# Patient Record
Sex: Male | Born: 1965 | Race: White | Hispanic: No | Smoking: Current some day smoker
Health system: Southern US, Community
[De-identification: ages and names within clinical notes are randomized; demographics above are authoritative.]

## PROBLEM LIST (undated history)

## (undated) DIAGNOSIS — R42 Dizziness and giddiness: Secondary | ICD-10-CM

## (undated) DIAGNOSIS — I219 Acute myocardial infarction, unspecified: Secondary | ICD-10-CM

## (undated) DIAGNOSIS — F419 Anxiety disorder, unspecified: Secondary | ICD-10-CM

## (undated) DIAGNOSIS — I639 Cerebral infarction, unspecified: Secondary | ICD-10-CM

## (undated) DIAGNOSIS — I10 Essential (primary) hypertension: Secondary | ICD-10-CM

## (undated) DIAGNOSIS — I2510 Atherosclerotic heart disease of native coronary artery without angina pectoris: Secondary | ICD-10-CM

## (undated) DIAGNOSIS — K5792 Diverticulitis of intestine, part unspecified, without perforation or abscess without bleeding: Secondary | ICD-10-CM

## (undated) DIAGNOSIS — N200 Calculus of kidney: Secondary | ICD-10-CM

## (undated) HISTORY — DX: Anxiety disorder, unspecified: F41.9

## (undated) HISTORY — DX: Cerebral infarction, unspecified: I63.9

## (undated) HISTORY — DX: Diverticulitis of intestine, part unspecified, without perforation or abscess without bleeding: K57.92

## (undated) HISTORY — DX: Dizziness and giddiness: R42

## (undated) HISTORY — DX: Calculus of kidney: N20.0

## (undated) HISTORY — PX: WISDOM TOOTH EXTRACTION: SHX21

## (undated) HISTORY — DX: Atherosclerotic heart disease of native coronary artery without angina pectoris: I25.10

---

## 1998-07-22 ENCOUNTER — Emergency Department (HOSPITAL_COMMUNITY): Admission: EM | Admit: 1998-07-22 | Discharge: 1998-07-22 | Payer: Self-pay | Admitting: Emergency Medicine

## 2004-12-24 HISTORY — PX: ANTERIOR CRUCIATE LIGAMENT REPAIR: SHX115

## 2005-02-21 ENCOUNTER — Ambulatory Visit: Payer: Self-pay | Admitting: Psychiatry

## 2005-02-21 ENCOUNTER — Other Ambulatory Visit (HOSPITAL_COMMUNITY): Admission: RE | Admit: 2005-02-21 | Discharge: 2005-03-08 | Payer: Self-pay | Admitting: Psychiatry

## 2006-03-05 ENCOUNTER — Encounter: Admission: RE | Admit: 2006-03-05 | Discharge: 2006-03-05 | Payer: Self-pay | Admitting: Family Medicine

## 2006-03-18 ENCOUNTER — Ambulatory Visit (HOSPITAL_BASED_OUTPATIENT_CLINIC_OR_DEPARTMENT_OTHER): Admission: RE | Admit: 2006-03-18 | Discharge: 2006-03-18 | Payer: Self-pay | Admitting: Orthopedic Surgery

## 2006-12-24 DIAGNOSIS — I219 Acute myocardial infarction, unspecified: Secondary | ICD-10-CM

## 2006-12-24 HISTORY — DX: Acute myocardial infarction, unspecified: I21.9

## 2006-12-24 HISTORY — PX: CORONARY STENT PLACEMENT: SHX1402

## 2007-08-11 ENCOUNTER — Ambulatory Visit: Payer: Self-pay | Admitting: Family Medicine

## 2007-10-23 ENCOUNTER — Ambulatory Visit: Payer: Self-pay | Admitting: Psychiatry

## 2007-10-23 ENCOUNTER — Inpatient Hospital Stay (HOSPITAL_COMMUNITY): Admission: AD | Admit: 2007-10-23 | Discharge: 2007-10-26 | Payer: Self-pay | Admitting: Psychiatry

## 2008-01-21 ENCOUNTER — Ambulatory Visit: Payer: Self-pay | Admitting: *Deleted

## 2008-01-21 ENCOUNTER — Inpatient Hospital Stay (HOSPITAL_COMMUNITY): Admission: EM | Admit: 2008-01-21 | Discharge: 2008-01-23 | Payer: Self-pay | Admitting: Orthopedic Surgery

## 2008-02-02 ENCOUNTER — Ambulatory Visit: Payer: Self-pay | Admitting: Cardiology

## 2008-02-02 LAB — CONVERTED CEMR LAB
Hemoglobin, Urine: NEGATIVE
Ketones, ur: NEGATIVE mg/dL
Leukocytes, UA: NEGATIVE
Specific Gravity, Urine: 1.015 (ref 1.000–1.03)
Total Protein, Urine: NEGATIVE mg/dL
Urine Glucose: NEGATIVE mg/dL
pH: 6 (ref 5.0–8.0)

## 2008-03-17 ENCOUNTER — Ambulatory Visit: Payer: Self-pay | Admitting: Cardiology

## 2008-04-27 ENCOUNTER — Ambulatory Visit: Payer: Self-pay | Admitting: Psychology

## 2008-05-11 ENCOUNTER — Ambulatory Visit: Payer: Self-pay | Admitting: Psychology

## 2008-05-26 ENCOUNTER — Ambulatory Visit: Payer: Self-pay | Admitting: Psychology

## 2008-06-09 ENCOUNTER — Ambulatory Visit: Payer: Self-pay | Admitting: Psychology

## 2008-06-24 ENCOUNTER — Ambulatory Visit: Payer: Self-pay | Admitting: Psychology

## 2008-07-08 ENCOUNTER — Ambulatory Visit: Payer: Self-pay | Admitting: Psychology

## 2008-07-22 ENCOUNTER — Ambulatory Visit: Payer: Self-pay | Admitting: Psychology

## 2008-07-30 ENCOUNTER — Ambulatory Visit: Payer: Self-pay | Admitting: Psychology

## 2008-08-05 ENCOUNTER — Ambulatory Visit: Payer: Self-pay | Admitting: Psychology

## 2008-08-12 ENCOUNTER — Ambulatory Visit: Payer: Self-pay | Admitting: Psychology

## 2008-08-19 ENCOUNTER — Ambulatory Visit: Payer: Self-pay | Admitting: Psychology

## 2008-08-26 ENCOUNTER — Ambulatory Visit: Payer: Self-pay | Admitting: Psychology

## 2008-09-16 ENCOUNTER — Ambulatory Visit: Payer: Self-pay | Admitting: Psychology

## 2008-09-30 ENCOUNTER — Ambulatory Visit: Payer: Self-pay | Admitting: Psychology

## 2008-10-14 ENCOUNTER — Ambulatory Visit: Payer: Self-pay | Admitting: Psychology

## 2008-10-28 ENCOUNTER — Ambulatory Visit: Payer: Self-pay | Admitting: Psychology

## 2008-11-11 ENCOUNTER — Ambulatory Visit: Payer: Self-pay | Admitting: Psychology

## 2008-11-25 ENCOUNTER — Ambulatory Visit: Payer: Self-pay | Admitting: Psychology

## 2008-12-09 ENCOUNTER — Ambulatory Visit: Payer: Self-pay | Admitting: Psychology

## 2008-12-23 ENCOUNTER — Ambulatory Visit: Payer: Self-pay | Admitting: Psychology

## 2009-01-06 ENCOUNTER — Ambulatory Visit: Payer: Self-pay | Admitting: Psychology

## 2009-01-20 ENCOUNTER — Ambulatory Visit: Payer: Self-pay | Admitting: Psychology

## 2009-02-03 ENCOUNTER — Ambulatory Visit: Payer: Self-pay | Admitting: Psychology

## 2009-02-17 ENCOUNTER — Ambulatory Visit: Payer: Self-pay | Admitting: Psychology

## 2009-03-03 ENCOUNTER — Ambulatory Visit: Payer: Self-pay | Admitting: Psychology

## 2009-03-17 ENCOUNTER — Ambulatory Visit: Payer: Self-pay | Admitting: Psychology

## 2009-03-24 ENCOUNTER — Ambulatory Visit: Payer: Self-pay | Admitting: Family Medicine

## 2009-03-31 ENCOUNTER — Ambulatory Visit: Payer: Self-pay | Admitting: Psychology

## 2009-04-14 ENCOUNTER — Ambulatory Visit: Payer: Self-pay | Admitting: Psychology

## 2009-04-28 ENCOUNTER — Ambulatory Visit: Payer: Self-pay | Admitting: Psychology

## 2009-06-09 ENCOUNTER — Ambulatory Visit: Payer: Self-pay | Admitting: Psychology

## 2009-06-23 ENCOUNTER — Ambulatory Visit: Payer: Self-pay | Admitting: Psychology

## 2009-07-07 ENCOUNTER — Ambulatory Visit: Payer: Self-pay | Admitting: Psychology

## 2009-07-21 ENCOUNTER — Ambulatory Visit: Payer: Self-pay | Admitting: Psychology

## 2009-08-04 ENCOUNTER — Ambulatory Visit: Payer: Self-pay | Admitting: Psychology

## 2009-08-18 ENCOUNTER — Ambulatory Visit: Payer: Self-pay | Admitting: Psychology

## 2009-09-15 ENCOUNTER — Ambulatory Visit: Payer: Self-pay | Admitting: Psychology

## 2009-09-27 ENCOUNTER — Ambulatory Visit: Payer: Self-pay | Admitting: Family Medicine

## 2009-09-29 ENCOUNTER — Ambulatory Visit: Payer: Self-pay | Admitting: Psychology

## 2009-09-30 ENCOUNTER — Ambulatory Visit: Payer: Self-pay | Admitting: Family Medicine

## 2009-10-13 ENCOUNTER — Ambulatory Visit: Payer: Self-pay | Admitting: Psychology

## 2009-10-27 ENCOUNTER — Ambulatory Visit: Payer: Self-pay | Admitting: Psychology

## 2009-11-10 ENCOUNTER — Ambulatory Visit: Payer: Self-pay | Admitting: Psychology

## 2009-12-08 ENCOUNTER — Ambulatory Visit: Payer: Self-pay | Admitting: Psychology

## 2009-12-22 ENCOUNTER — Ambulatory Visit: Payer: Self-pay | Admitting: Psychology

## 2010-01-05 ENCOUNTER — Ambulatory Visit: Payer: Self-pay | Admitting: Psychology

## 2010-01-19 ENCOUNTER — Ambulatory Visit: Payer: Self-pay | Admitting: Psychology

## 2010-02-16 ENCOUNTER — Ambulatory Visit: Payer: Self-pay | Admitting: Psychology

## 2010-03-02 ENCOUNTER — Ambulatory Visit: Payer: Self-pay | Admitting: Psychology

## 2010-03-16 ENCOUNTER — Ambulatory Visit: Payer: Self-pay | Admitting: Psychology

## 2010-03-30 ENCOUNTER — Ambulatory Visit: Payer: Self-pay | Admitting: Psychology

## 2010-04-27 ENCOUNTER — Ambulatory Visit: Payer: Self-pay | Admitting: Psychology

## 2010-05-11 ENCOUNTER — Ambulatory Visit: Payer: Self-pay | Admitting: Psychology

## 2010-05-25 ENCOUNTER — Ambulatory Visit: Payer: Self-pay | Admitting: Psychology

## 2010-06-08 ENCOUNTER — Ambulatory Visit: Payer: Self-pay | Admitting: Psychology

## 2010-06-22 ENCOUNTER — Ambulatory Visit: Payer: Self-pay | Admitting: Psychology

## 2010-07-06 ENCOUNTER — Ambulatory Visit: Payer: Self-pay | Admitting: Psychology

## 2010-08-03 ENCOUNTER — Ambulatory Visit: Payer: Self-pay | Admitting: Psychology

## 2010-08-17 ENCOUNTER — Ambulatory Visit: Payer: Self-pay | Admitting: Psychology

## 2010-08-31 ENCOUNTER — Ambulatory Visit: Payer: Self-pay | Admitting: Psychology

## 2010-09-14 ENCOUNTER — Ambulatory Visit: Payer: Self-pay | Admitting: Psychology

## 2010-09-22 ENCOUNTER — Ambulatory Visit: Payer: Self-pay | Admitting: Licensed Clinical Social Worker

## 2010-09-28 ENCOUNTER — Ambulatory Visit: Payer: Self-pay | Admitting: Psychology

## 2010-09-29 ENCOUNTER — Ambulatory Visit: Payer: Self-pay | Admitting: Licensed Clinical Social Worker

## 2010-10-06 ENCOUNTER — Ambulatory Visit: Payer: Self-pay | Admitting: Licensed Clinical Social Worker

## 2010-10-12 ENCOUNTER — Ambulatory Visit: Payer: Self-pay | Admitting: Psychology

## 2010-10-20 ENCOUNTER — Ambulatory Visit: Payer: Self-pay | Admitting: Licensed Clinical Social Worker

## 2010-10-26 ENCOUNTER — Ambulatory Visit: Payer: Self-pay | Admitting: Psychology

## 2010-10-27 ENCOUNTER — Ambulatory Visit: Payer: Self-pay | Admitting: Licensed Clinical Social Worker

## 2010-11-09 ENCOUNTER — Ambulatory Visit: Payer: Self-pay | Admitting: Psychology

## 2010-11-10 ENCOUNTER — Ambulatory Visit: Payer: Self-pay | Admitting: Licensed Clinical Social Worker

## 2010-11-23 ENCOUNTER — Ambulatory Visit: Payer: Self-pay | Admitting: Psychology

## 2010-11-24 ENCOUNTER — Ambulatory Visit: Payer: Self-pay | Admitting: Licensed Clinical Social Worker

## 2010-12-01 ENCOUNTER — Ambulatory Visit: Payer: Self-pay | Admitting: Licensed Clinical Social Worker

## 2010-12-07 ENCOUNTER — Ambulatory Visit: Payer: Self-pay | Admitting: Psychology

## 2011-01-04 ENCOUNTER — Ambulatory Visit
Admission: RE | Admit: 2011-01-04 | Discharge: 2011-01-04 | Payer: Self-pay | Source: Home / Self Care | Attending: Psychology | Admitting: Psychology

## 2011-01-18 ENCOUNTER — Ambulatory Visit
Admission: RE | Admit: 2011-01-18 | Discharge: 2011-01-18 | Payer: Self-pay | Source: Home / Self Care | Attending: Psychology | Admitting: Psychology

## 2011-02-01 ENCOUNTER — Ambulatory Visit: Payer: Self-pay | Admitting: Psychology

## 2011-02-15 ENCOUNTER — Ambulatory Visit (INDEPENDENT_AMBULATORY_CARE_PROVIDER_SITE_OTHER): Payer: Self-pay | Admitting: Psychology

## 2011-02-15 DIAGNOSIS — F331 Major depressive disorder, recurrent, moderate: Secondary | ICD-10-CM

## 2011-03-01 ENCOUNTER — Ambulatory Visit (INDEPENDENT_AMBULATORY_CARE_PROVIDER_SITE_OTHER): Payer: BC Managed Care – PPO | Admitting: Psychology

## 2011-03-01 DIAGNOSIS — F331 Major depressive disorder, recurrent, moderate: Secondary | ICD-10-CM

## 2011-03-15 ENCOUNTER — Ambulatory Visit: Payer: BC Managed Care – PPO | Admitting: Psychology

## 2011-03-29 ENCOUNTER — Ambulatory Visit (INDEPENDENT_AMBULATORY_CARE_PROVIDER_SITE_OTHER): Payer: Self-pay | Admitting: Psychology

## 2011-03-29 DIAGNOSIS — F331 Major depressive disorder, recurrent, moderate: Secondary | ICD-10-CM

## 2011-04-12 ENCOUNTER — Ambulatory Visit: Payer: BC Managed Care – PPO | Admitting: Psychology

## 2011-04-26 ENCOUNTER — Ambulatory Visit: Payer: Self-pay | Admitting: Psychology

## 2011-05-08 NOTE — Assessment & Plan Note (Signed)
[facility]                            CARDIOLOGY OFFICE NOTE   ESMERALDA, BLANFORD                     MRN:          [id number]  DATE:02/02/2008                            DOB:          November 24, 1966    PRIMARY CARE PHYSICIAN:  Dr. Blair Heys.   CLINICAL HISTORY:  Mr. Lumadue is 45 year old and has a history of  hypertension and presented to [facility] with chest pain and he had  transient ST elevation inferior leads.  His pain resolved and ST-segment  elevation resolved has not taken emergently to the cath lab but he was  studied early the next day and by Dr. Excell Seltzer and found to have 2  ruptured plaque and the mid-to-distal and distal right coronary treated  with non-overlapping PROMUS stent.  He had minimal elevation of his  enzymes and LV function and angiography was normal.   He has done quite well since that time had no recurrent chest pain,  shortness breath or palpitations.   PAST MEDICAL HISTORY:  Significant for hypertension, history of the  depression for which he takes Celexa and smoking history.  His current  medications include Micardis/hydrochlorothiazide 80/25 daily, Flomax,  aspirin, Lipitor 80 mg daily.  Metoprolol extended release 25 mg daily,  Plavix 75 mg daily.   EXAMINATION:  Today blood pressure was 123/86 and pulse 68 and regular.  There was no venous distension.  The carotid were full without bruits.  CHEST:  Clear.  HEART:  Rhythm is regular.  No murmurs or gallops.  Abdomen was soft organomegaly.  Right femoral artery site had moderate  knot but there was no widening pulsation and no bruits.  No peripheral  edema.   The electrocardiogram was normal.   IMPRESSION:  1. Recent aborted diaphragmatic wall infarction treated with      subsequent stenting of the mid-to-distal and distal right coronary      with drug-eluting stent.  2. Normal LV function.  3. Hypertension.  4. Depression.  5. Hyperlipidemia.   RECOMMENDATIONS:  I think Mr. Cadman is doing well.  He is still  smoking about half a pack of cigarettes a day down from two packs a day  and says he is making progress.  He is scheduled to be involved with  rehab too.  Discuss the possibility of adding drug help with smoking but  he said he tried a nicotine patch and they gave a rash.  His history of  depression, will not want to use Chantix, might consider switching from  Celexa, Wellbutrin the future if he is having difficulty.  His HDL was  30 and I think he would be better off Crestor and Lipitor 80 so we will  switch him from Lipitor 80 to Crestor 20,   after finishes Lipitor.  Will get a lipid and liver profile in 6 weeks.  I will see him back in 6 weeks.  He can resume work at this point  without restrictions.  He works as a Curator.   ADDENDUM:  He did indicate is having frequency of urination and I we got  a urinalysis today to evaluate that.     Bruce Elvera Lennox Juanda Chance, MD, [facility]  Electronically Signed    BRB/MedQ  DD: 02/02/2008  DT: 02/02/2008  Job #: [id number]

## 2011-05-08 NOTE — Cardiovascular Report (Signed)
NAME:  Ricardo Stone, Ricardo Stone NO.:  [id number]   MEDICAL RECORD NO.:  [id number]          PATIENT TYPE:  INP   LOCATION:  2908                         FACILITY:  [facility]   PHYSICIAN:  Veverly Fells. Excell Seltzer, MD  DATE OF BIRTH:  03-20-1966   DATE OF PROCEDURE:  01/21/2008  DATE OF DISCHARGE:                            CARDIAC CATHETERIZATION   PROCEDURE:  Left heart catheterization, selective coronary angiography,  left ventricular angiography, PTCA and stenting of the right coronary  artery, and Angio-Seal of the right femoral artery.   INDICATION:  Ricardo Stone is a 45 year old gentleman with a history of  tobacco use and hypertension.  He presented last night with substernal  chest pain.  His EKG demonstrated transient inferoposterior injury  current.  After initiating medical therapy with Plavix, heparin and  nitroglycerin, his ST changes completely resolved.  His troponin is now  elevated at 0.4,  and he was referred for cardiac catheterization.   Risks and indications of the procedure were reviewed with the patient in  detail.  Informed consent was obtained.  The right groin was prepped,  draped, anesthetized with 1% lidocaine.  Using the modified Seldinger  technique, a 6-French sheath was placed in the right femoral artery.  Standard 6-French Judkins catheters were used for coronary angiography.  An angled pigtail catheter was used for left ventriculography.  A  pullback across the aortic valve was performed.   At completion of the diagnostic procedure, I elected to intervene on the  right coronary artery.  The right coronary artery had a moderate plaque  at the junction of the mid and distal vessel and a severe,  ulcerated-  appearing plaque at the distal vessel that involved the PDA, posterior  AV branch bifurcation.  The patient had been preloaded with Plavix.  Angiomax was used for anticoagulation.  The 6-French sheath was changed  out for a 7-French sheath in  anticipation of treating the bifurcation.  A 6-French JR-4 guide catheter was inserted.  Once a therapeutic ACT was  achieved, a Cougar guidewire was passed into the posterolateral branch  and a BMW guidewire was passed into the PDA branch.  The lesion was  wired with a moderate amount of difficulty.  There were tandem lesions  in the vessel, and I elected to treat the distal bifurcation area first.  A 2.0 x 15 mm Maverick balloon was placed across the distal right  coronary artery into the PDA and was dilated to 6 atmospheres on  multiple inflations.  A 2.0 x 15 mm Voyager balloon was then placed from  the distal right coronary artery into the posterior AV branch, and  kissing balloon inflations were performed up to 6 atmospheres on the  Maverick and 8 atmospheres on the Voyager.  At that point, the balloons  were removed.  Nitroglycerin was given and angiography was performed  once again.  I elected to stent into the PDA branch.  Both branches were  of equal size.  A 2.5 x 18 mm Promus stent was deployed at 12  atmospheres and was well expanded.  The stent was then  postdilated with  a 2.75 x 12 mm Quantum Maverick balloon up to 16 and then 18  atmospheres.  Following stenting, there was an excellent angiographic  result with TIMI III flow into both branch vessels.  There was  significant ostial stenosis of the AV branch, but there was TIMI III  flow and the patient was chest pain free.  I then primarily stented the  more proximal lesion with a 3.0 x 18 mm Promus stent, which was deployed  at 16 atmospheres.  The stent was well expanded.  The stented segment  was then postdilated with 3.5 x 12 mm Quantum Maverick up to 16  atmospheres on 2 inflations.  At the completion of the procedure, there  was an excellent angiographic result with TIMI III flow throughout the  vessel.  The PDA branch had an excellent result.  The posterior AV  branch had a compromised ostium with significant ostial  stenosis but  with normal flow, and the patient was chest pain free.  I elected not to  treat that vessel.  An Angio-Seal device was used to seal the femoral  arteriotomy.  The patient tolerated the entire procedure very well and  had no immediate complications.   FINDINGS:  Aortic pressure 103/67 with a mean of 83, left ventricular  pressure 103/12.   Left mainstem:  The left mainstem is a very short segment with no  significant stenosis, bifurcates into the LAD and left circumflex.   LAD:  The LAD is a large-caliber vessel that just reaches the distal  anterior wall.  Proximally there is nonobstructive plaque.  Just beyond  the second diagonal branch there is mild diffuse stenosis of  approximately 30%.  The remaining portions of the vessel have no  significant angiographic disease.  There is a small first diagonal and a  medium-sized second diagonal branch.  The second diagonal has 30%  proximal stenosis.   Left circumflex is large-caliber.  It courses down and supplies a large  first OM branch that has no significant angiographic stenosis.  The AV  groove circumflex is a smaller vessel that also has no significant  stenosis.   The right coronary artery is dominant.  It is a large vessel that  bifurcates into the PDA and posterior AV branch that supplies one  posterolateral.  The proximal and midportions of the right coronary  artery have nonobstructive plaque.  The distal portion has a 70%  stenosis, and the very distal right coronary artery at the bifurcation  of the PDA and posterior AV segment has a 95% ulcerated plaque with a  hypodense area.  There is TIMI III flow in the vessel.   Left ventriculography is normal.  The LVEF is 65% to 70%.  There is no  mitral regurgitation.   ASSESSMENT:  1. Severe distal right coronary artery stenosis (ulcerated plaque)      with successful percutaneous coronary intervention using two Promus      drug-eluting stents.  2. Mild left  anterior descending stenosis.  3. Normal left circumflex.  4. Normal left ventricular function.   PLAN:  Ricardo Stone should receive a minimum of 12 months of dual  antiplatelet therapy with aspirin and Plavix.  Post MI medical therapy  will be instituted as tolerated.  He will be observed carefully in the  recovery area.      Veverly Fells. Excell Seltzer, MD  Electronically Signed     MDC/MEDQ  D:  01/21/2008  T:  01/21/2008  Job:  [id number]   cc:   Bryan Lemma. Manus Gunning, M.D.

## 2011-05-08 NOTE — H&P (Signed)
NAME:  Ricardo Stone, Ricardo Stone NO.:  [id number]   MEDICAL RECORD NO.:  [id number]          PATIENT TYPE:  INP   LOCATION:  2908                         FACILITY:  [facility]   PHYSICIAN:  Unice Cobble, MD     DATE OF BIRTH:  02-Mar-1966   DATE OF ADMISSION:  01/21/2008  DATE OF DISCHARGE:                              HISTORY & PHYSICAL   PRIMARY CARE PHYSICIAN:  The patient's primary M.D. is Bryan Lemma.  Manus Gunning, M.D. from [place].   CHIEF COMPLAINT:  Chest pain.   HISTORY OF THE PRESENT ILLNESS:  This is a 45 year old white male with a  history of hypertension who presents with chest pain.  The pain started  30 minutes prior to arrival after having intercourse.  The patient had  6/10 chest discomfort with diaphoresis, nausea and vomiting, and  palpitations.  He has radiation of pain to both shoulders as well as  neck bilaterally.  In the ambulance he was thought to have 1 mm ST  elevation in the inferior leads with T wave inversions in the aVL.  A  Code STEMI was called.  He was given 324 mg of baby aspirin and  sublingual nitroglycerin.  In the emergency department he was given 600  mg of Plavix and a 3,000-unit heparin bolus with decrease of pain to  1/10, and located primarily only in the neck at this time.  His EKG  changes had completely resolved by that time.   PAST MEDICAL HISTORY:  1. Hypertension.  2. Depression.  3. Negative stress test two years ago.   ALLERGIES:  No known drug allergies.   MEDICATIONS:  1. Micardis 80/25 mg daily.  2. Lexapro 40 mg daily.  3. Diltiazem 240 mg extended release daily.   SOCIAL HISTORY:  Lives in [place] alone.  He is a Curator.  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  No alcohol or drugs.   FAMILY HISTORY:  The patient's father died of a heart attack at the age  of 35.   REVIEW OF SYSTEMS:  Complete review of systems was done and found to be  otherwise negative, except as stated in the HPI.   PHYSICAL EXAMINATION:  VITAL SIGNS:   The patient is afebrile with a  pulse of 73 and a respiratory rate of 12.  Blood pressure is 137/90.  His O2 sats are 98% on 2 liters.  GENERAL APPEARANCE:  In general this is a thin white male in no acute  distress.  HEENT:  The head, eyes, ears, nose and throat shows PERRLA.  EOMI.  Moist mucous membranes.  Oropharynx is clear without erythema or  exudates.  NECK:  The neck is supple without lymphadenopathy, thyromegaly, bruits  or jugular venous distention.  HEART:  The heart has a regular rate and rhythm with normal S1 and S2.  No murmurs, gallops or rubs.  The PMI is nondisplaced.  Pulses are 2+  and equal bilaterally without bruits.  LUNGS:  The lungs are clear to auscultation bilaterally.  SKIN:  The skin has no rash or lesions.  ABDOMEN:  The abdomen is soft  and nontender without rebound or guarding.  Normal bowel sounds.  Negative hepatosplenomegaly.  EXTREMITIES:  The extremities show no clubbing, cyanosis or edema.  SKIN:  No rashes, lesions or petechiae.  MUSCULOSKELETAL:  The musculoskeletal exam shows no joint deformities or  effusions.  No spinal tenderness.  NEUROLOGIC EXAMINATION:  Neurologically he is alert and oriented times  three with grossly normal cranial nerves.  Strength and sensation are  normal throughout.   LABORATORY DATA:  X-ray of his chest shows no acute cardiopulmonary  disease.  EKG shows a rate of 62 and normal sinus rhythm.  There is a T  wave inversion in the aVL.  There are no ST elevations or depressions.  Labs are pending at this time.   ASSESSMENT AND PLAN:  This is a 45 year old white male with a history of  hypertension and smoking, and a positive family history who presents  with chest pain.  His field electrocardiogram is concerning, but not  diagnostic for STEMI in that it has noncharacteristic ST changes, is not  clearly elevated in two contiguous leads, and does not have reciprocal  changes.  He furthermore has no ST elevations here  in the emergency  room.  His diagnosis could be coronary spasm versus resolved ST  elevation myocardial infarction versus non-ST elevation myocardial  infarction versus unstable angina or noncardiac chest pain.   I will treat him for presumed NSTEMI and treat with heparin, beta  blocker, nitroglycerin drip, aspirin, and morphine as needed.  I will  monitor his cardiac enzymes for signs of myocardial damage and plan on a  cardiac catheterization in the morning.      Unice Cobble, MD  Electronically Signed     ACJ/MEDQ  D:  01/21/2008  T:  01/21/2008  Job:  [id number]

## 2011-05-08 NOTE — Discharge Summary (Signed)
NAME:  Ricardo Stone, Ricardo Stone              ACCOUNT NO.:  [id number]   MEDICAL RECORD NO.:  [id number]          PATIENT TYPE:  INP   LOCATION:  2908                         FACILITY:  [facility]   PHYSICIAN:  Everardo Beals. Juanda Chance, MD, FACCDATE OF BIRTH:  Dec 01, 1966   DATE OF ADMISSION:  01/21/2008  DATE OF DISCHARGE:  01/23/2008                               DISCHARGE SUMMARY   PRIMARY CARDIOLOGIST:  Everardo Beals. Juanda Chance, MD, [facility]   PRIMARY CARE PHYSICIAN:  Bryan Lemma. Manus Gunning, M.D.   PROCEDURES PERFORMED DURING HOSPITALIZATION:  Emergent cardiac  catheterization by Bruce R. Juanda Chance, MD, [facility] revealing:  1. Severe distal right coronary artery stenosis (ulcerated plaque).  2. Successful PCI with 2 Promus drug-eluting stents.  3. Mild LAD stenosis, normal  left circumflex and normal LV function.   FINAL DISCHARGE DIAGNOSES:  1. Aborted ST elevated myocardial infarction.  2. Status post cardiac catheterization with PCI to the right coronary      artery using 2 drug-eluting stents.   SECONDARY DIAGNOSES:  1. Tobacco abuse.  2. Hypertension.  3. Depression.   HOSPITAL COURSE:  This is a 45 year old Caucasian male with a history of  hypertension who presented to the emergency room  with chest pain.  The  patient had chest pain approximately 30 minutes after having  intercourse. The patient had 6 to 10 chest discomfort with diaphoresis,  nausea, vomiting, and palpitations.  The radiation of the pain to both  shoulders as well as the neck bilaterally.  The patient called EMS.  The  EMS EKG revealed 1 ml ST elevation in the inferior leads with T wave  inversion in AVL.  Code STEMI was called and the patient was brought  emergently to cardiac catheterization lab at [facility]. He was given  324 mg of baby aspirin, sublingual nitroglycerin.  On arrival to the  emergency department, the patient was given 600 mg of Plavix  and 3,000  units of heparin bolus with decrease of pain to 1/10.  His EKG  completely  resolved on arrival to the emergency room .   The patient was seen and examined by Unice Cobble, MD fellow for Everardo Beals. Juanda Chance, MD, [facility].  The patient was admitted to ICU for further  observation with planned cardiac catheterization to follow in the  morning as emergent catheterization was unnecessary secondary to pain  relief and EKG changes resolved.   The following morning the patient had no complaints of chest pain.  He  was in normal sinus rhythm with no ectopy. The patient had no EKG  changes the following morning.  The patient was seen and examined by Dr.  Juanda Chance and had a cardiac catheterization completed on January 28, the  morning of admission with results described above.  Please see Dr.  Inda Castle through cardiac catheterization note for more detail.   The patient did have severe distal right coronary artery stenosis with  ulcerated plaque and had successful PCI using 2 Promus drug-eluting  stents per Dr. Juanda Chance.  The patient tolerated the procedure well with no  evidence of further discomfort or ectopy post procedure.  The patient's  right groin recovered well but did have some moderate amount of  ecchymosis noted.  There was mild soreness at the site but with no  severe complaints of pain.  No bruit.  No active bleeding.   During hospitalization, the patient's Cardizem  which he was on prior to  admission was discontinued and he was started on Lopressor 12.5 mg  b.i.d.  he was also started on aspirin, Plavix , Lipitor, and Avapro.  On review of medications prior to discharge, Dr. Juanda Chance resumed the  patient's Micardis, HCTZ, as he was taking at home and Metoprolol  substituted for Cardizem  and he was asked to stop taking Cardizem  at  home.   The patient was also given smoking cessation instructions along with  cardiac rehab instructions for post MI activities and life style  changes.  The patient verbalized understanding.  On the day of discharge  the patient was  seen and examined by Dr. Juanda Chance and myself and was found  to be stable.  He will follow up with Dr. Juanda Chance in his office in  approximately 2 weeks for continued evaluation of coronary disease and  the patient's response to medications.   DISCHARGE LABORATORY:  Sodium 137, potassium 3.5, chloride 110, CO2 of  24, BUN 8, creatinine 0.78, glucose 100, cholesterol 138, triglycerides  68, HDL 30, LDL 94, hemoglobin 15.4, hematocrit 45.2, white blood cells  14.8, platelets 153. Chest x-ray revealed questionable LVH otherwise  normal.   VITAL SIGNS AT DISCHARGE:  Blood pressure 119/78.  Heart rate 57.  Respirations  20.   DISCHARGE MEDICATIONS:  1. Aspirin  325 mg daily.  2. Plavix 75 mg daily (prescription provided).  3. Metoprolol 25 mg daily (prescription provided).  4. Lipitor 80 mg daily (prescription provided).  5. Nitroglycerin 0.4 mg p.r.n. sublingual for chest pain (prescription      provided).  6. Micardis/HCTZ 80/25 mg daily.  7. Citalopram 40 mg daily.   ALLERGIES:  No known drug allergies.   FOLLOWUP PLANS AND APPOINTMENTS:  1. The patient will follow up with Bruce R. Juanda Chance, MD, [facility] on      February 9 at 9:45 a.m. for continued evaluation and management of      the patient's coronary artery disease .  2. The patient has been given post cardiac catheterization      instructions with particular emphasis on the right groin site for      evidence of bleeding, severe pain, infection, or swelling.  3. The patient has been given smoking cessation instructions.  4. The patient is to follow up with his primary care physician for      continued medical management.  5. The patient has been seen by cardiac rehab and has been advised on      outpatient cardiac rehab and continued life style modification.  6. Time spent with the patient to include physician time 45 minutes.      Bettey Mare. Lyman Bishop, NP      Everardo Beals. Juanda Chance, MD, [facility]  Electronically Signed    KML/MEDQ  D:   01/23/2008  T:  01/23/2008  Job:  [id number]   cc:   Bryan Lemma. Manus Gunning, M.D.

## 2011-05-08 NOTE — Assessment & Plan Note (Signed)
[facility]                            CARDIOLOGY OFFICE NOTE   Ricardo, Stone                     MRN:          [id number]  DATE:03/17/2008                            DOB:          10-Jan-1966    PRIMARY CARE PHYSICIAN:  Bryan Lemma. Manus Gunning, M.D.   CLINICAL HISTORY:  Ricardo Stone is a 45 year old and returns for  management of his coronary heart disease.  He was hospitalized in  January with transient ST elevation which resolved, and he subsequently  had catheterization and two non-overlapping stents placed in the right  coronary by Dr. Excell Seltzer.  He had minimal elevation of his enzymes and his  LV function was normal.   He is a smoker and has not been able to stop yet.  He saw Dr. Manus Gunning  who prescribed the Chantix.   PAST MEDICAL HISTORY:  Significant for hypertension.   CURRENT MEDICATIONS:  1. Micardis/hydrochlorothiazide 80/25 daily.  2. Aspirin.  3. Metoprolol XL 25 mg daily.  4. Plavix 75 mg daily.  5. Crestor 20 mg daily.  6. Chantix which is not used, not yet started which was prescribed by      Dr. Manus Gunning.   PHYSICAL EXAMINATION:  VITAL SIGNS:  Blood pressure was 125/80 and pulse  71 and regular.  There was no venous distention.  The carpals were full  without bruits.  CHEST:  Clear.  HEART:  Rhythm is regular.  No murmurs or gallops.  ABDOMEN:  Soft, normal bowel sounds.  EXTREMITIES:  Peripheral pulses were full.  No peripheral edema.   IMPRESSION:  1. Coronary artery disease status post diaphragmatic wall infarction      with spontaneous reperfusion and subsequent placement of non-      overlapping drug-eluting stents in the right coronary artery      January 2009.  2. Normal LV function.  3. Hypertension.  4. Hyperlipidemia with low HDL.   RECOMMENDATIONS:  Ricardo Stone is doing well with exception of  cigarettes.  Dr. Manus Gunning is managing this, and has recommended Chantix.  Will need to be careful about the depressive  symptoms related to this.  Will plan to get a lipid liver profile to follow up on switching him  from  Lipitor to Crestor, hoping the HDL would be better on Crestor than  Lipitor.  I plan to see him back in follow-up in 6 months.     Bruce Elvera Lennox Juanda Chance, MD, [facility]  Electronically Signed    BRB/MedQ  DD: 03/17/2008  DT: 03/17/2008  Job #: [id number]

## 2011-05-10 ENCOUNTER — Ambulatory Visit (INDEPENDENT_AMBULATORY_CARE_PROVIDER_SITE_OTHER): Payer: Self-pay | Admitting: Psychology

## 2011-05-10 DIAGNOSIS — F331 Major depressive disorder, recurrent, moderate: Secondary | ICD-10-CM

## 2011-05-11 NOTE — Op Note (Signed)
NAME:  Ricardo Stone, Ricardo Stone              ACCOUNT NO.:  [id number]   MEDICAL RECORD NO.:  [id number]          PATIENT TYPE:  AMB   LOCATION:  DSC                          FACILITY:  [facility]   PHYSICIAN:  Feliberto Gottron. Turner Daniels, M.D.   DATE OF BIRTH:  Dec 14, 1966   DATE OF PROCEDURE:  03/18/2006  DATE OF DISCHARGE:  03/18/2006                                 OPERATIVE REPORT   PREOPERATIVE DIAGNOSIS:  Left knee anterior cruciate ligament tear, medial  and lateral meniscal tears.   POSTOPERATIVE DIAGNOSIS:  Left knee anterior cruciate ligament tear, medial  and lateral meniscal tears, fairly large cartilaginous loose body.   PROCEDURE:  Left knee partial arthroscopic medial meniscectomy, large parrot-  beak tear, partial lateral meniscectomy, posterior horn, and then allograft  anterior cruciate ligament reconstruction using an 11-mm-wide bone-tendon-  bone allograft and 8 x 20 femoral and tibial titanium screws, removal of  fairly large loose body.   SURGEON:  Feliberto Gottron. Turner Daniels, M.D.   FIRST ASSISTANT:  Skip Mayer, P.A.-C   ANESTHESIOLOGIST:  General endotracheal.   ESTIMATED BLOOD LOSS:  Minimal.   FLUID REPLACEMENT:  A liter of crystalloid.   DRAINS PLACED:  None.   TOURNIQUET TIME:  None.   INDICATIONS FOR PROCEDURE:  The patient is a 45 year old man who was  initially evaluated by one of my partners, Dr. Althea Charon, for a fall he  sustained off of a platform of about 3 feet, where he suffered a valgus  stress to his knee.  After a thorough workup including physical examination,  x-rays and finally an MRI scan, he was found to have an ACL tear and he was  also quite active.  Despite conservative treatment, he had continued giving  away of his knee and I saw him in consultation for possible ACL  reconstruction versus continued conservative care on 15 March 2006 and at  that time, he had pretty much a full range of motion and a positive  Lachman's test.  The MRI scan was reviewed and it did  show a complete ACL  tear, lateral and medial meniscal tears as well as an intermediate strain of  the lateral collateral ligament.  Risks and benefits of surgery were  discussed at length with the patient and he is prepared for surgical  intervention.   DESCRIPTION OF PROCEDURE:  The patient was identified by arm band and taken  to the operating room at [facility], where the appropriate  anesthetic monitors were attached and general endotracheal anesthesia  induced with the patient in a supine position, lateral post applied to the  table as well as a foot positioner and the left lower extremity was then  prepped and draped in the usual sterile fashion from the ankle to the mid-  thigh.  Using a #11 blade, standard inferomedial and inferolateral  parapatellar portals were then made, allowing introduction of the  arthroscope into the knee joint.  Diagnostic arthroscopy revealed a normal  patellofemoral articulation, a large parrot-beak tear of the posterior horn  of the medial meniscus, which was removed with the straight-biters as well  as a 4.2 Great White sucker shaver and a 3.5 gator sucker shaver.  We also  encountered a fairly large cartilaginous loose bodies, which was removed as  well.  We then directed our attention to the lateral compartment, where the  patient had a radial tear of the posterior horn of the lateral meniscus;  this was debrided back to a stable margin with a 4.2 Great White sucker  shaver.  We then directed our attention to the notch, where the ACL was  noted to be completely torn and it was resected back to the origin of the  femur and the insertion on the tibial.  The knee was then flexed to 45  degrees with the knee positioner and we performed a standard superolateral  notchplasty with a hooded vortex bur, removing 1 mm of bone from the  superior and lateral quadrants of the notch region.  The Linvatec tibial pin  positioning guide was then set at  55 degrees and placed through the  inferomedial portal so that the tip was on the posterior footprint of the  ACL insertion.  We then moved the entry guide about a centimeter and a half  medial to the tibial tubercle and made a 0.5-cm incision longitudinal in the  skin down to the bone, allowing passage of a pin up through the tibia a  centimeter and a half medial to and a centimeter distal to the tibial  tubercle, up through the posterior aspect of the tibial footprint.  At this  point, my skilled assistant, Skip Mayer, was at the back table preparing  the allograft and fashioning it into an 11-mm-wide bone-tendon-bone  construct with a 20-gauge wire through the tibial bone plug.  The patellar  bone plug was 23 mm in length.  We then over-reamed the guidepin on the knee  to 11 mm and then using the 7-mm over-the-top guide, placed a second pin in  the superior posterolateral aspect of the femoral notch at the 1:30 position  to a depth of about 35 mm and then over-reamed this with the 11-mm Badger  reamer to a depth of about 30 mm.  A superior notch was then placed in the  femoral tunnel and a lateral notch in the tibial tunnel using the Linvatec  notch-cutting guides.  The knee was then thoroughly irrigated out with  normal saline solution, all sharp edges were removed with the bur and the  4.2 Great White sucker shaver and then the graft was passed with the  patellar plug going up to the tibial tunnel first and then the patella plug  was then threaded into the femoral tunnel using a supplemental inferomedial  portal that was about 8 mm in length.  Satisfied with the position of the  patellar bone plug in the femoral tunnel, a nitinol wire was then passed  through the inferomedial supplemental portal with the knee hyperflexed to  about 95 degrees and then that bone plug was locked into placed using an 8 x  20 Linvatec Propel titanium screw, obtaining good firm fixation and with a good  squeak as the screw was driven home.  The knee was taken through a  range of motion, confirming isometry.  The graft was then twisted 90 degrees  clockwise distally and the tibial bone plug was locked into the tibia using  an 8 x 20 titanium Linvatec Propel screw as well with the knee flexed at 45  degrees, a reversed Lachman's maneuver placed on the proximal  tibia and  traction placed on the graft of about 40 pounds.  Once again, the graft was  probed to make sure it had good tension in flexion, mid and full extension  and then the 20-gauge wire was cut and removed.  Any protruding bone plug of  about 4 or 5 mm was also smoothed back and the knee was irrigated out with  normal saline solution.  At this point, the arthroscopic instruments were  removed.  The centimeter-and-a-half incision used for passage of the graft  was closed with 3-0 Vicryl subcutaneous suture and 4-0 Monocryl subcuticular  suture.  The other portals were left open and a dressing of Xeroform, 4 x 4  dressing sponges, Webril and an Ace wrap applied.  The patient was then  awakened and taken to the recovery room without difficulty.  The patient did  receive a gram of Ancef perioperatively.      Feliberto Gottron. Turner Daniels, M.D.  Electronically Signed     FJR/MEDQ  D:  04/17/2006  T:  04/18/2006  Job:  [id number]   cc:   [facility]

## 2011-05-11 NOTE — Discharge Summary (Signed)
NAME:  [name], [name] NO.:  [id number]   MEDICAL RECORD NO.:  [id number]          PATIENT TYPE:  IPS   LOCATION:  0603                          FACILITY:  [facility]   PHYSICIAN:  Anselm Jungling, MD  DATE OF BIRTH:  1966-03-02   DATE OF ADMISSION:  10/23/2007  DATE OF DISCHARGE:  10/26/2007                               DISCHARGE SUMMARY   IDENTIFYING DATA/REASON FOR ADMISSION:  This was an inpatient  psychiatric admission for Ricardo Stone, a 45 year old divorced white male  admitted due to increasing insomnia and feelings of exhaustion.  He  stated that he fell apart.  He stated that he had been increasingly  overwhelmed and sad over everyday stuff.  He had recently gone through  a divorce.  He had been on our outpatient intensive program 2-3 years  ago and felt it was helpful.  He was having mild suicidal thoughts, but  no plan or intention prior to admission.  He stated that he had in the  past trials of antidepressants that either did not help or made him feel  wound up.  He had just started with a new therapist, whose name he  cannot recall.  He believed that there was a positive family history for  mood disorder.  Please refer to the admission note for further details  pertaining to the circumstances, symptoms and history that led to his  hospitalization.  He was given an initial Axis I diagnosis of major  depressive disorder recurrent.   MEDICAL/LABORATORY:  The patient reported that he had a history of  hypertension.  He was medically and physically assessed by the  psychiatric nurse practitioner.  He was treated with Diltiazem 240 mg  q.h.s. and Micardis 80 mg daily.  There were no acute medical issues.   HOSPITAL COURSE:  The patient was admitted to the adult inpatient  psychiatric service.  He presented as a well-nourished, well-developed  male who was alert, oriented, pleasant and cooperative.  He denied any  suicidal ideation.  He appeared to be a good  historian with a good level  of insight.  He denied any active suicidal ideation and verbalized a  strong desire for help.  We discussed the possibility of antidepressant  medication trials and another course of the intensive outpatient  program.   The patient was started on a trial of Lexapro 10 mg daily and this was  well tolerated.  Ambien 5 mg at bedtime was utilized on a p.r.n. basis  for insomnia.   On the fourth hospital day, the patient had been absent suicidal  ideation throughout his stay.  He stated that he was feeling better and  sleeping well.  He indicated that he felt comfortable with discharge to  outpatient aftercare.  He agreed to following aftercare plan.   AFTERCARE:  The patient was to follow up with Dr. Betti Stone at [facility] with an appointment on October 27, 2007 and a referral to  [facility] in [place] with an appointment on October 27, 2007 as well.   DISCHARGE MEDICATIONS:  1. Lexapro 10  mg daily.  2. Ambien 5 mg q.h.s. if needed for insomnia.  3. Diltiazem 240 mg q.h.s.  4. Micardis 80 mg daily.   DISCHARGE DIAGNOSES:  AXIS I:  Major depressive disorder, recurrent.  AXIS II:  Deferred.  AXIS III:  History of hypertension.  AXIS IV:  Stressors severe.  AXIS V:  Global assessment of functioning on discharge 60.      Anselm Jungling, MD  Electronically Signed     SPB/MEDQ  D:  10/27/2007  T:  10/28/2007  Job:  [id number]

## 2011-05-24 ENCOUNTER — Ambulatory Visit (INDEPENDENT_AMBULATORY_CARE_PROVIDER_SITE_OTHER): Payer: Self-pay | Admitting: Psychology

## 2011-05-24 DIAGNOSIS — F331 Major depressive disorder, recurrent, moderate: Secondary | ICD-10-CM

## 2011-06-07 ENCOUNTER — Ambulatory Visit: Payer: Self-pay | Admitting: Psychology

## 2011-06-21 ENCOUNTER — Ambulatory Visit: Payer: Self-pay | Admitting: Psychology

## 2011-09-13 LAB — LIPID PANEL
Cholesterol: 138
Cholesterol: 143
HDL: 28 — ABNORMAL LOW
HDL: 30 — ABNORMAL LOW
LDL Cholesterol: 94
LDL Cholesterol: 96
Total CHOL/HDL Ratio: 5.1
Total CHOL/HDL Ratio: 5.5
Triglycerides: 104
Triglycerides: 68
VLDL: 30

## 2011-09-13 LAB — BASIC METABOLIC PANEL
BUN: 8
CO2: 26
Calcium: 8.2 — ABNORMAL LOW
Calcium: 8.8
Creatinine, Ser: 0.78
GFR calc Af Amer: >60
GFR calc non Af Amer: >60
GFR calc non Af Amer: >60
Glucose, Bld: 100 — ABNORMAL HIGH
Sodium: 141

## 2011-09-13 LAB — CBC
HCT: 41
HCT: 44.4
HCT: 45.2
Hemoglobin: 14.7
MCHC: 34.2
MCHC: 34.6
MCV: 91.2
Platelets: 147 — ABNORMAL LOW
Platelets: 153
RBC: 4.86
RDW: 13.2
RDW: 13.3
RDW: 13.8
WBC: 14 — ABNORMAL HIGH
WBC: 14.8 — ABNORMAL HIGH
WBC: 16.4 — ABNORMAL HIGH

## 2011-09-13 LAB — DIFFERENTIAL
Basophils Absolute: 0.1
Basophils Relative: 1
Lymphocytes Relative: 41
Lymphs Abs: 5.7 — ABNORMAL HIGH
Neutro Abs: 6.5

## 2011-09-13 LAB — I-STAT 8, (EC8 V) (CONVERTED LAB)
Bicarbonate: 26.7 — ABNORMAL HIGH
Chloride: 108
Glucose, Bld: 120 — ABNORMAL HIGH
HCT: 47
Hemoglobin: 16
Sodium: 138
TCO2: 28
pCO2, Ven: 30.5 — ABNORMAL LOW
pH, Ven: 7.55 — ABNORMAL HIGH

## 2011-09-13 LAB — HEPATIC FUNCTION PANEL
ALT: 17
AST: 19
Albumin: 3.5

## 2011-09-13 LAB — CARDIAC PANEL(CRET KIN+CKTOT+MB+TROPI)
Total CK: 199
Total CK: 251 — ABNORMAL HIGH

## 2011-09-13 LAB — POCT CARDIAC MARKERS
Myoglobin, poc: 35.8
Operator id: 257131

## 2011-09-13 LAB — HEPARIN LEVEL (UNFRACTIONATED)
Heparin Unfractionated: 0.27 — ABNORMAL LOW
Heparin Unfractionated: <0.1 — ABNORMAL LOW

## 2011-09-13 LAB — PROTIME-INR
INR: 0.9
INR: 0.9

## 2011-09-13 LAB — HEMOGLOBIN A1C: Mean Plasma Glucose: 111

## 2011-10-03 LAB — URINALYSIS, ROUTINE W REFLEX MICROSCOPIC
Glucose, UA: NEGATIVE
Hgb urine dipstick: NEGATIVE
Ketones, ur: NEGATIVE
pH: 6.5

## 2011-10-03 LAB — URINE DRUGS OF ABUSE SCREEN W ALC, ROUTINE (REF LAB)
Barbiturate Quant, Ur: NEGATIVE
Creatinine,U: 58.7
Methadone: NEGATIVE
Phencyclidine (PCP): NEGATIVE
Propoxyphene: NEGATIVE

## 2011-10-03 LAB — TSH: TSH: 2.786

## 2011-10-03 LAB — COMPREHENSIVE METABOLIC PANEL
Alkaline Phosphatase: 58
BUN: 11
Chloride: 102
GFR calc non Af Amer: >60
Glucose, Bld: 97
Potassium: 4.2
Total Bilirubin: 0.8

## 2011-10-03 LAB — CBC
HCT: 48
Hemoglobin: 15.9
MCV: 91.9
WBC: 13 — ABNORMAL HIGH

## 2011-12-17 ENCOUNTER — Encounter: Payer: Self-pay | Admitting: *Deleted

## 2011-12-17 ENCOUNTER — Emergency Department (HOSPITAL_COMMUNITY): Payer: Self-pay

## 2011-12-17 ENCOUNTER — Inpatient Hospital Stay (HOSPITAL_COMMUNITY)
Admission: EM | Admit: 2011-12-17 | Discharge: 2011-12-25 | DRG: 330 | Disposition: A | Discharge status: Home / Self Care | Payer: Self-pay | Attending: Surgery | Admitting: Surgery

## 2011-12-17 DIAGNOSIS — K5792 Diverticulitis of intestine, part unspecified, without perforation or abscess without bleeding: Secondary | ICD-10-CM

## 2011-12-17 DIAGNOSIS — I252 Old myocardial infarction: Secondary | ICD-10-CM

## 2011-12-17 DIAGNOSIS — K560 Paralytic ileus: Secondary | ICD-10-CM | POA: Diagnosis not present

## 2011-12-17 DIAGNOSIS — K9189 Other postprocedural complications and disorders of digestive system: Secondary | ICD-10-CM

## 2011-12-17 DIAGNOSIS — K5732 Diverticulitis of large intestine without perforation or abscess without bleeding: Principal | ICD-10-CM | POA: Diagnosis present

## 2011-12-17 DIAGNOSIS — K668 Other specified disorders of peritoneum: Secondary | ICD-10-CM

## 2011-12-17 DIAGNOSIS — I10 Essential (primary) hypertension: Secondary | ICD-10-CM

## 2011-12-17 DIAGNOSIS — K5720 Diverticulitis of large intestine with perforation and abscess without bleeding: Secondary | ICD-10-CM | POA: Diagnosis present

## 2011-12-17 DIAGNOSIS — F17200 Nicotine dependence, unspecified, uncomplicated: Secondary | ICD-10-CM | POA: Diagnosis present

## 2011-12-17 DIAGNOSIS — R109 Unspecified abdominal pain: Secondary | ICD-10-CM

## 2011-12-17 DIAGNOSIS — Z87891 Personal history of nicotine dependence: Secondary | ICD-10-CM

## 2011-12-17 DIAGNOSIS — K567 Ileus, unspecified: Secondary | ICD-10-CM

## 2011-12-17 DIAGNOSIS — Z87442 Personal history of urinary calculi: Secondary | ICD-10-CM

## 2011-12-17 DIAGNOSIS — D72828 Other elevated white blood cell count: Secondary | ICD-10-CM

## 2011-12-17 HISTORY — DX: Acute myocardial infarction, unspecified: I21.9

## 2011-12-17 HISTORY — DX: Essential (primary) hypertension: I10

## 2011-12-17 LAB — COMPREHENSIVE METABOLIC PANEL
ALT: 13 U/L (ref 0–53)
AST: 12 U/L (ref 0–37)
CO2: 25 mEq/L (ref 19–32)
Calcium: 9.6 mg/dL (ref 8.4–10.5)
Chloride: 104 mEq/L (ref 96–112)
GFR calc non Af Amer: >90 mL/min (ref >90)
Sodium: 137 mEq/L (ref 135–145)

## 2011-12-17 LAB — DIFFERENTIAL
Basophils Absolute: 0 10*3/uL (ref 0.0–0.1)
Eosinophils Relative: 1 % (ref 0–5)
Lymphocytes Relative: 14 % (ref 12–46)
Neutro Abs: 15.4 10*3/uL — ABNORMAL HIGH (ref 1.7–7.7)
Neutrophils Relative %: 79 % — ABNORMAL HIGH (ref 43–77)

## 2011-12-17 LAB — CBC
Platelets: 137 10*3/uL — ABNORMAL LOW (ref 150–400)
RDW: 13.7 % (ref 11.5–15.5)
WBC: 19.6 10*3/uL — ABNORMAL HIGH (ref 4.0–10.5)

## 2011-12-17 LAB — URINALYSIS, ROUTINE W REFLEX MICROSCOPIC
Glucose, UA: NEGATIVE mg/dL
Hgb urine dipstick: NEGATIVE
Protein, ur: NEGATIVE mg/dL
Specific Gravity, Urine: 1.021 (ref 1.005–1.030)

## 2011-12-17 MED ORDER — ONDANSETRON HCL 4 MG/2ML IJ SOLN
4.0000 mg | Freq: Four times a day (QID) | INTRAMUSCULAR | Status: DC | PRN
  Administered 2011-12-21: 4 mg via INTRAVENOUS
  Filled 2011-12-17: qty 2

## 2011-12-17 MED ORDER — ERTAPENEM SODIUM 1 G IJ SOLR
1.0000 g | INTRAMUSCULAR | Status: DC
  Filled 2011-12-17: qty 1

## 2011-12-17 MED ORDER — HYDROMORPHONE HCL PF 1 MG/ML IJ SOLN
1.0000 mg | INTRAMUSCULAR | Status: DC | PRN
  Administered 2011-12-18 – 2011-12-25 (×39): 1 mg via INTRAVENOUS
  Filled 2011-12-17 (×40): qty 1

## 2011-12-17 MED ORDER — ONDANSETRON HCL 4 MG/2ML IJ SOLN
4.0000 mg | Freq: Once | INTRAMUSCULAR | Status: AC
  Administered 2011-12-17: 4 mg via INTRAVENOUS
  Filled 2011-12-17: qty 2

## 2011-12-17 MED ORDER — SODIUM CHLORIDE 0.9 % IV SOLN
1.0000 g | Freq: Once | INTRAVENOUS | Status: AC
  Administered 2011-12-17 – 2011-12-18 (×2): 1 g via INTRAVENOUS
  Filled 2011-12-17: qty 1

## 2011-12-17 MED ORDER — SODIUM CHLORIDE 0.9 % IV BOLUS (SEPSIS)
1000.0000 mL | Freq: Once | INTRAVENOUS | Status: AC
  Administered 2011-12-18: 1000 mL via INTRAVENOUS

## 2011-12-17 MED ORDER — HYDROMORPHONE HCL PF 1 MG/ML IJ SOLN
1.0000 mg | Freq: Once | INTRAMUSCULAR | Status: AC
  Administered 2011-12-17: 1 mg via INTRAVENOUS
  Filled 2011-12-17: qty 1

## 2011-12-17 MED ORDER — IOHEXOL 300 MG/ML  SOLN
100.0000 mL | Freq: Once | INTRAMUSCULAR | Status: AC | PRN
  Administered 2011-12-17: 100 mL via INTRAVENOUS

## 2011-12-17 MED ORDER — SODIUM CHLORIDE 0.9 % IV BOLUS (SEPSIS)
1000.0000 mL | Freq: Once | INTRAVENOUS | Status: AC
  Administered 2011-12-17: 1000 mL via INTRAVENOUS

## 2011-12-17 MED ORDER — KCL IN DEXTROSE-NACL 30-5-0.45 MEQ/L-%-% IV SOLN
INTRAVENOUS | Status: DC
  Administered 2011-12-18 (×2): 200 mL via INTRAVENOUS
  Filled 2011-12-17 (×7): qty 1000

## 2011-12-17 NOTE — ED Provider Notes (Signed)
History     CSN: [id number]  Arrival date & time 12/17/11  4098   First MD Initiated Contact with Patient 12/17/11 2008      Chief Complaint  Patient presents with  . Abdominal Pain    (Consider location/radiation/quality/duration/timing/severity/associated sxs/prior treatment) HPI Comments: Hx kidney stones and states that this is different pain  Patient is a 45 y.o. male presenting with abdominal pain. The history is provided by the patient. No language interpreter was used.  Abdominal Pain The primary symptoms of the illness include abdominal pain, nausea and vomiting. The primary symptoms of the illness do not include fever, fatigue, shortness of breath, diarrhea or dysuria. The current episode started yesterday. The onset of the illness was gradual. The problem has been gradually worsening.  The abdominal pain began yesterday. The pain came on gradually. The abdominal pain has been gradually worsening since its onset. The abdominal pain is located in the periumbilical region. The abdominal pain does not radiate. The abdominal pain is relieved by nothing. The abdominal pain is exacerbated by vomiting.  Nausea began yesterday. The nausea is exacerbated by food.  The vomiting began today. Vomiting occurred once. The emesis contains stomach contents.  The patient has had a change in bowel habit. Symptoms associated with the illness do not include anorexia, constipation, urgency, hematuria, frequency or back pain.    Past Medical History  Diagnosis Date  . Myocardial infarct 2008  . Kidney stones   . Hypertension     Past Surgical History  Procedure Date  . Anterior cruciate ligament repair 2006  . Coronary stent placement 2008    Family History  Problem Relation Age of Onset  . Hypertension Mother   . Stroke Mother   . Heart failure Father   . Hypertension Father     History  Substance Use Topics  . Smoking status: Current Everyday Smoker -- 1.0 packs/day for 10  years    Types: Cigarettes  . Smokeless tobacco: Not on file  . Alcohol Use: Not on file      Review of Systems  Constitutional: Negative for fever, activity change, appetite change and fatigue.  HENT: Negative for congestion, sore throat, rhinorrhea, neck pain and neck stiffness.   Respiratory: Negative for cough and shortness of breath.   Cardiovascular: Negative for chest pain and palpitations.  Gastrointestinal: Positive for nausea, vomiting and abdominal pain. Negative for diarrhea, constipation and anorexia.  Genitourinary: Negative for dysuria, urgency, frequency, hematuria and flank pain.  Musculoskeletal: Negative for back pain.  Neurological: Negative for dizziness, weakness, light-headedness, numbness and headaches.  All other systems reviewed and are negative.    Allergies  Review of patient's allergies indicates no known allergies.  Home Medications  No current outpatient prescriptions on file.  BP 161/116  Pulse 101  Temp(Src) 98.9 F (37.2 C) (Oral)  Resp 20  SpO2 97%  Physical Exam  Nursing note and vitals reviewed. Constitutional: He is oriented to person, place, and time. He appears well-developed and well-nourished. No distress.  HENT:  Head: Normocephalic and atraumatic.  Mouth/Throat: Oropharynx is clear and moist.  Eyes: Conjunctivae and EOM are normal. Pupils are equal, round, and reactive to light.  Neck: Normal range of motion. Neck supple.  Cardiovascular: Normal rate, regular rhythm, normal heart sounds and intact distal pulses.  Exam reveals no gallop and no friction rub.   No murmur heard. Pulmonary/Chest: Effort normal and breath sounds normal. No respiratory distress.  Abdominal: Soft. Bowel sounds are normal. There is  tenderness (rlq pain at mcburney point). There is guarding. There is no rebound.  Musculoskeletal: Normal range of motion. He exhibits no tenderness.  Neurological: He is alert and oriented to person, place, and time. No  cranial nerve deficit.  Skin: Skin is warm and dry. No rash noted.    ED Course  Procedures (including critical care time)  CRITICAL CARE Performed by: Dayton Bailiff   Total critical care time: 30 min  Critical care time was exclusive of separately billable procedures and treating other patients.  Critical care was necessary to treat or prevent imminent or life-threatening deterioration.  Critical care was time spent personally by me on the following activities: development of treatment plan with patient and/or surrogate as well as nursing, discussions with consultants, evaluation of patient's response to treatment, examination of patient, obtaining history from patient or surrogate, ordering and performing treatments and interventions, ordering and review of laboratory studies, ordering and review of radiographic studies, pulse oximetry and re-evaluation of patient's condition.   Labs Reviewed  CBC - Abnormal; Notable for the following:    WBC 19.6 (*)    Platelets 137 (*)    All other components within normal limits  DIFFERENTIAL - Abnormal; Notable for the following:    Neutrophils Relative 79 (*)    Neutro Abs 15.4 (*)    Monocytes Absolute 1.3 (*)    All other components within normal limits  COMPREHENSIVE METABOLIC PANEL - Abnormal; Notable for the following:    Glucose, Bld 127 (*)    All other components within normal limits  URINALYSIS, ROUTINE W REFLEX MICROSCOPIC - Abnormal; Notable for the following:    APPearance CLOUDY (*)    Ketones, ur TRACE (*)    All other components within normal limits  LIPASE, BLOOD   Ct Abdomen Pelvis W Contrast  12/17/2011  *RADIOLOGY REPORT*  Clinical Data: Pelvic pain.  Evaluate for diverticulitis.  CT ABDOMEN AND PELVIS WITH CONTRAST  Technique:  Multidetector CT imaging of the abdomen and pelvis was performed following the standard protocol during bolus administration of intravenous contrast.  Contrast: OMNIPAQUE IOHEXOL 300  MG/ML IV SOLN  Comparison: None.  Findings: There is intra-abdominal free air present compatible with perforated viscus.  Inflammatory changes are present in the sigmoid colon with gas tracking in the small bowel mesentery.  There is adjacent enteritis of a loop of small bowel.  No abscess is identified.  Incidental visualization of the lung bases demonstrates coronary artery atherosclerosis.  The liver appears within normal limits. The gallbladder appears normal.  No calcified gallstones.  Pancreas appears normal.  The spleen normal.  Stomach distended with oral contrast.  Proximal small bowel appears normal.  Aortic atherosclerosis without aneurysm.  Normal renal enhancement and excretion of contrast bilaterally.  The rectum appears normal.  Prostate gland and urinary bladder are normal.  Distal sigmoid diverticulosis.  Normal appendix.  The appendix is immediately adjacent to the inflammatory changes of the sigmoid colon.  Although it is difficult to completely exclude perforated tip appendicitis, perforated diverticulitis is more likely.  The amount of free air would be a typical with perforated appendicitis and the more proximal appendix extending to the cecum appears normal.  No aggressive osseous lesions are present. Discogenic sclerosis is present in the superior L3 endplate. Mildly exaggerated thoracolumbar kyphosis.  IMPRESSION: 1.  Free intra-abdominal air compatible with perforated viscus. Critical Value/emergent results were called by telephone at the time of interpretation on 12/17/2011  at 2211 hours  to  Dr. Brooke Dare, who verbally acknowledged these results. 2.  Source of perforation is favored represent perforated sigmoid diverticulitis.  Air-filled appendix is present nearby however this degree of free intraperitoneal air would be unusual for appendicitis.  Phlegmon is present in the adjacent mesentery without abscess. 3.  Inflammatory changes of the loop of small bowel nearby at the site of suspected  perforation. 4.  Atherosclerosis and coronary artery disease.  Original Report Authenticated By: Andreas Newport, M.D.     1. Perforated diverticulum of large intestine   2. Pneumoperitoneum   3. Diverticulitis       MDM  Patient with perforated diverticulitis with pneumoperitoneum on CAT scan. He received pain medication one emergency department with multiple reassessment. He did not want any additional pain medication on these reassessment. He is found to have a leukocytosis of 19.6. He was started on Invanz. He was kept n.p.o. I discussed the case with the general surgeon on call who will admit the patient for laparotomy.        Dayton Bailiff, MD 12/17/11 2226

## 2011-12-17 NOTE — H&P (Signed)
Chief Complaint  Patient presents with  . Abdominal Pain    HISTORY: Patient is a 45 yo white male with one week history of abdominal pain.  Pain became more severe over the past 24 hours and localized to the lower abdomen.  Denies nausea or vomiting.  Denies fever.  Few chills.  No previous episodes of diverticulitis.  No prior abdominal surgery.  Presents to ER.  WBC elevated at 19K.  CT abd with free air and findings of acute diverticulitis.   Past Medical History  Diagnosis Date  . Myocardial infarct 2008  . Kidney stones   . Hypertension      Current Facility-Administered Medications  Medication Dose Route Frequency Provider Last Rate Last Dose  . ertapenem (INVANZ) 1 g in sodium chloride 0.9 % 50 mL IVPB  1 g Intravenous Once Dayton Bailiff, MD   1 g at 12/17/11 2226  . HYDROmorphone (DILAUDID) injection 1 mg  1 mg Intravenous Once Dayton Bailiff, MD   1 mg at 12/17/11 2128  . iohexol (OMNIPAQUE) 300 MG/ML solution 100 mL  100 mL Intravenous Once PRN Medication Radiologist   100 mL at 12/17/11 2202  . ondansetron (ZOFRAN) injection 4 mg  4 mg Intravenous Once Dayton Bailiff, MD   4 mg at 12/17/11 2132  . sodium chloride 0.9 % bolus 1,000 mL  1,000 mL Intravenous Once Dayton Bailiff, MD   1,000 mL at 12/17/11 2128   No current outpatient prescriptions on file.     No Known Allergies   Family History  Problem Relation Age of Onset  . Hypertension Mother   . Stroke Mother   . Heart failure Father   . Hypertension Father      History   Social History  . Marital Status: Divorced    Spouse Name: N/A    Number of Children: N/A  . Years of Education: N/A   Social History Main Topics  . Smoking status: Current Everyday Smoker -- 1.0 packs/day for 10 years    Types: Cigarettes  . Smokeless tobacco: None  . Alcohol Use: None  . Drug Use: No  . Sexually Active:    Other Topics Concern  . None   Social History Narrative  . None     REVIEW OF SYSTEMS - PERTINENT  POSITIVES ONLY: Diffuse abdominal pain, no fever, few chills.  No h/o diverticulitis.   EXAM: Filed Vitals:   12/17/11 1922  BP: 161/116  Pulse: 101  Temp: 98.9 F (37.2 C)  Resp: 20    HEENT: normocephalic; pupils equal and reactive; sclerae clear; dentition good; mucous membranes moist NECK:  No mass; symmetric on extension; no palpable anterior or posterior cervical lymphadenopathy; no supraclavicular masses; no tenderness CHEST: clear to auscultation bilaterally without rales, rhonchi, or wheezes CARDIAC: regular rate and rhythm without significant murmur; peripheral pulses are full ABDOMEN: Moderate distension; diffuse tenderness to palpation & percussion; no incisions; BS present EXT:  non-tender without edema; no deformity NEURO: no gross focal deficits; no sign of tremor   LABORATORY RESULTS: See E-Chart for most recent results  RADIOLOGY RESULTS: See E-Chart or I-Site for most recent results  IMPRESSION: Acute diverticulitis with perforation  PLAN: Admit to surgical service Initiate abx with Invanz - given IV hydration NPO Pain Rx To OR in AM for exploratory laparotomy with sigmoid resection and end descending colostomy  The risks and benefits of the procedure have been discussed at length with the patient.  The patient understands the proposed procedure, potential  alternative treatments, and the course of recovery to be expected.  All of the patient's questions have been answered at this time.  The patient wishes to proceed with surgery.  Velora Heckler, MD, FACS General & Endocrine Surgery [facility]    Visit Diagnoses: 1. Perforated diverticulum of large intestine   2. Pneumoperitoneum   3. Diverticulitis     Primary Care Physician: No primary provider on file.

## 2011-12-17 NOTE — ED Notes (Signed)
Starting last night, the patient began having lower abdominal pain that is generalized.  Pt expresses pain upon light to moderate palpation in all 4 quadrants including epigastric region.  Pt last had a bowel movement about 2 days ago and states it was per his norm.  Pt experiences nausea upon increase in said pain that has once made him vomit.

## 2011-12-17 NOTE — ED Notes (Signed)
Pt presented to rm 4 c/o lower abdominal pain scale 5/10 radiating to mid abdomen, with on and off nausea, no vomiting, last bm was 2 days ago

## 2011-12-18 ENCOUNTER — Other Ambulatory Visit: Payer: Self-pay

## 2011-12-18 ENCOUNTER — Other Ambulatory Visit (INDEPENDENT_AMBULATORY_CARE_PROVIDER_SITE_OTHER): Payer: Self-pay | Admitting: Surgery

## 2011-12-18 ENCOUNTER — Inpatient Hospital Stay (HOSPITAL_COMMUNITY): Payer: Self-pay | Admitting: Certified Registered Nurse Anesthetist

## 2011-12-18 ENCOUNTER — Encounter (HOSPITAL_COMMUNITY): Payer: Self-pay | Admitting: Surgery

## 2011-12-18 ENCOUNTER — Encounter (HOSPITAL_COMMUNITY): Payer: Self-pay | Admitting: Certified Registered Nurse Anesthetist

## 2011-12-18 ENCOUNTER — Encounter (HOSPITAL_COMMUNITY): Admission: EM | Disposition: A | Discharge status: Home / Self Care | Payer: Self-pay | Source: Home / Self Care

## 2011-12-18 HISTORY — PX: LAPAROTOMY: SHX154

## 2011-12-18 HISTORY — PX: OTHER SURGICAL HISTORY: SHX169

## 2011-12-18 HISTORY — PX: COLOSTOMY REVISION: SHX5232

## 2011-12-18 SURGERY — LAPAROTOMY, EXPLORATORY
Anesthesia: General | Site: Abdomen | Wound class: Dirty or Infected

## 2011-12-18 MED ORDER — ACETAMINOPHEN 10 MG/ML IV SOLN
INTRAVENOUS | Status: AC
  Filled 2011-12-18: qty 100

## 2011-12-18 MED ORDER — METOPROLOL TARTRATE 1 MG/ML IV SOLN
INTRAVENOUS | Status: DC | PRN
  Administered 2011-12-18: 1 mg via INTRAVENOUS
  Administered 2011-12-18: 2 mg via INTRAVENOUS
  Administered 2011-12-18: 1 mg via INTRAVENOUS

## 2011-12-18 MED ORDER — PROMETHAZINE HCL 25 MG/ML IJ SOLN: 6.2500 mg | INTRAMUSCULAR | Status: DC | PRN

## 2011-12-18 MED ORDER — SODIUM CHLORIDE 0.9 % IV SOLN
INTRAVENOUS | Status: AC
  Filled 2011-12-18: qty 50

## 2011-12-18 MED ORDER — HYDROMORPHONE HCL PF 1 MG/ML IJ SOLN
INTRAMUSCULAR | Status: AC
  Filled 2011-12-18: qty 1

## 2011-12-18 MED ORDER — FENTANYL CITRATE 0.05 MG/ML IJ SOLN
INTRAMUSCULAR | Status: DC | PRN
  Administered 2011-12-18 (×2): 100 ug via INTRAVENOUS
  Administered 2011-12-18: 50 ug via INTRAVENOUS

## 2011-12-18 MED ORDER — SODIUM CHLORIDE 0.9 % IV SOLN: 1.0000 g | INTRAVENOUS | Status: DC

## 2011-12-18 MED ORDER — PROPOFOL 10 MG/ML IV EMUL
INTRAVENOUS | Status: DC | PRN
  Administered 2011-12-18: 170 mg via INTRAVENOUS

## 2011-12-18 MED ORDER — HYDRALAZINE HCL 20 MG/ML IJ SOLN
INTRAMUSCULAR | Status: AC
  Filled 2011-12-18: qty 1

## 2011-12-18 MED ORDER — MEPERIDINE HCL 50 MG/ML IJ SOLN: 6.2500 mg | INTRAMUSCULAR | Status: DC | PRN

## 2011-12-18 MED ORDER — HYDROMORPHONE HCL PF 1 MG/ML IJ SOLN
INTRAMUSCULAR | Status: DC | PRN
  Administered 2011-12-18 (×2): 1 mg via INTRAVENOUS

## 2011-12-18 MED ORDER — GLYCOPYRROLATE 0.2 MG/ML IJ SOLN
INTRAMUSCULAR | Status: DC | PRN
  Administered 2011-12-18: .6 mg via INTRAVENOUS

## 2011-12-18 MED ORDER — SODIUM CHLORIDE 0.9 % IV SOLN
1.0000 g | INTRAVENOUS | Status: DC
  Administered 2011-12-19 – 2011-12-25 (×7): 1 g via INTRAVENOUS
  Filled 2011-12-18 (×8): qty 1

## 2011-12-18 MED ORDER — KCL IN DEXTROSE-NACL 30-5-0.45 MEQ/L-%-% IV SOLN
INTRAVENOUS | Status: DC
  Administered 2011-12-18 – 2011-12-21 (×7): via INTRAVENOUS
  Administered 2011-12-21: 125 mL via INTRAVENOUS
  Administered 2011-12-22: 15:00:00 via INTRAVENOUS
  Administered 2011-12-22: 125 mL via INTRAVENOUS
  Administered 2011-12-23 – 2011-12-24 (×3): via INTRAVENOUS
  Filled 2011-12-18 (×17): qty 1000

## 2011-12-18 MED ORDER — METOPROLOL TARTRATE 1 MG/ML IV SOLN
INTRAVENOUS | Status: AC
  Filled 2011-12-18: qty 5

## 2011-12-18 MED ORDER — LACTATED RINGERS IV SOLN
INTRAVENOUS | Status: DC | PRN
  Administered 2011-12-18: 09:00:00 via INTRAVENOUS

## 2011-12-18 MED ORDER — KCL IN DEXTROSE-NACL 30-5-0.45 MEQ/L-%-% IV SOLN
INTRAVENOUS | Status: AC
  Filled 2011-12-18: qty 1000

## 2011-12-18 MED ORDER — LIDOCAINE HCL (CARDIAC) 20 MG/ML IV SOLN
INTRAVENOUS | Status: DC | PRN
  Administered 2011-12-18: 80 mg via INTRAVENOUS

## 2011-12-18 MED ORDER — PROMETHAZINE HCL 25 MG/ML IJ SOLN: 12.5000 mg | Freq: Four times a day (QID) | INTRAMUSCULAR | Status: DC | PRN

## 2011-12-18 MED ORDER — SODIUM CHLORIDE 0.9 % IR SOLN
Status: DC | PRN
  Administered 2011-12-18: 2000 mL

## 2011-12-18 MED ORDER — HYDROMORPHONE HCL PF 1 MG/ML IJ SOLN
0.2500 mg | INTRAMUSCULAR | Status: DC | PRN
  Administered 2011-12-18 (×4): 0.5 mg via INTRAVENOUS

## 2011-12-18 MED ORDER — SUCCINYLCHOLINE CHLORIDE 20 MG/ML IJ SOLN
INTRAMUSCULAR | Status: DC | PRN
  Administered 2011-12-18: 100 mg via INTRAVENOUS

## 2011-12-18 MED ORDER — HYDROMORPHONE HCL PF 1 MG/ML IJ SOLN
INTRAMUSCULAR | Status: AC
  Administered 2011-12-18: 1 mg
  Filled 2011-12-18: qty 1

## 2011-12-18 MED ORDER — ROCURONIUM BROMIDE 100 MG/10ML IV SOLN
INTRAVENOUS | Status: DC | PRN
  Administered 2011-12-18: 20 mg via INTRAVENOUS
  Administered 2011-12-18: 40 mg via INTRAVENOUS

## 2011-12-18 MED ORDER — ENOXAPARIN SODIUM 40 MG/0.4ML ~~LOC~~ SOLN
40.0000 mg | SUBCUTANEOUS | Status: DC
  Administered 2011-12-19 – 2011-12-25 (×7): 40 mg via SUBCUTANEOUS
  Filled 2011-12-18 (×7): qty 0.4

## 2011-12-18 MED ORDER — ACETAMINOPHEN 10 MG/ML IV SOLN
INTRAVENOUS | Status: DC | PRN
  Administered 2011-12-18: 1000 mg via INTRAVENOUS

## 2011-12-18 MED ORDER — KCL IN DEXTROSE-NACL 30-5-0.45 MEQ/L-%-% IV SOLN
INTRAVENOUS | Status: DC
  Administered 2011-12-18: 13:00:00 via INTRAVENOUS
  Filled 2011-12-18 (×3): qty 1000

## 2011-12-18 MED ORDER — DEXAMETHASONE SODIUM PHOSPHATE 10 MG/ML IJ SOLN
INTRAMUSCULAR | Status: DC | PRN
  Administered 2011-12-18: 10 mg via INTRAVENOUS

## 2011-12-18 MED ORDER — ONDANSETRON HCL 4 MG/2ML IJ SOLN
INTRAMUSCULAR | Status: DC | PRN
  Administered 2011-12-18: 4 mg via INTRAVENOUS

## 2011-12-18 MED ORDER — NEOSTIGMINE METHYLSULFATE 1 MG/ML IJ SOLN
INTRAMUSCULAR | Status: DC | PRN
  Administered 2011-12-18: 5 mg via INTRAVENOUS

## 2011-12-18 SURGICAL SUPPLY — 43 items
APPLICATOR COTTON TIP 6IN STRL (MISCELLANEOUS) ×1 IMPLANT
BLADE EXTENDED COATED 6.5IN (ELECTRODE) IMPLANT
BLADE HEX COATED 2.75 (ELECTRODE) ×2 IMPLANT
CANISTER SUCTION 2500CC (MISCELLANEOUS) ×2 IMPLANT
CLOTH BEACON ORANGE TIMEOUT ST (SAFETY) ×2 IMPLANT
COVER MAYO STAND STRL (DRAPES) IMPLANT
DRAPE LAPAROSCOPIC ABDOMINAL (DRAPES) ×2 IMPLANT
DRAPE WARM FLUID 44X44 (DRAPE) ×1 IMPLANT
DRSG PAD ABDOMINAL 8X10 ST (GAUZE/BANDAGES/DRESSINGS) ×1 IMPLANT
ELECT REM PT RETURN 9FT ADLT (ELECTROSURGICAL) ×2
ELECTRODE REM PT RTRN 9FT ADLT (ELECTROSURGICAL) ×1 IMPLANT
GAUZE SPONGE 4X4 12PLY STRL LF (GAUZE/BANDAGES/DRESSINGS) ×1 IMPLANT
GLOVE BIOGEL PI IND STRL 7.0 (GLOVE) ×1 IMPLANT
GLOVE BIOGEL PI INDICATOR 7.0 (GLOVE) ×1
GLOVE SURG ORTHO 8.0 STRL STRW (GLOVE) ×6 IMPLANT
GOWN STRL NON-REIN LRG LVL3 (GOWN DISPOSABLE) ×2 IMPLANT
GOWN STRL REIN XL XLG (GOWN DISPOSABLE) ×4 IMPLANT
KIT BASIN OR (CUSTOM PROCEDURE TRAY) ×2 IMPLANT
LIGASURE IMPACT 36 18CM CVD LR (INSTRUMENTS) ×1 IMPLANT
NS IRRIG 1000ML POUR BTL (IV SOLUTION) ×4 IMPLANT
PACK GENERAL/GYN (CUSTOM PROCEDURE TRAY) ×2 IMPLANT
SPONGE GAUZE 4X4 12PLY (GAUZE/BANDAGES/DRESSINGS) ×1 IMPLANT
SPONGE LAP 18X18 X RAY DECT (DISPOSABLE) ×1 IMPLANT
STAPLER GUN LINEAR PROX 60 (STAPLE) ×1 IMPLANT
STAPLER PROXIMATE 75MM BLUE (STAPLE) ×1 IMPLANT
STAPLER VISISTAT 35W (STAPLE) ×2 IMPLANT
SUCTION POOLE TIP (SUCTIONS) ×1 IMPLANT
SUT NOV 1 T60/GS (SUTURE) IMPLANT
SUT NOVA NAB DX-16 0-1 5-0 T12 (SUTURE) ×5 IMPLANT
SUT SILK 2 0 (SUTURE) ×2
SUT SILK 2 0 SH CR/8 (SUTURE) ×1 IMPLANT
SUT SILK 2-0 18XBRD TIE 12 (SUTURE) IMPLANT
SUT SILK 3 0 (SUTURE) ×2
SUT SILK 3 0 SH CR/8 (SUTURE) ×1 IMPLANT
SUT SILK 3-0 18XBRD TIE 12 (SUTURE) IMPLANT
SUT VIC AB 3-0 SH 18 (SUTURE) ×1 IMPLANT
SUT VICRYL 2 0 18  UND BR (SUTURE)
SUT VICRYL 2 0 18 UND BR (SUTURE) IMPLANT
TAPE HYPAFIX 4 X10 (GAUZE/BANDAGES/DRESSINGS) ×1 IMPLANT
TOWEL OR 17X26 10 PK STRL BLUE (TOWEL DISPOSABLE) ×3 IMPLANT
TRAY FOLEY CATH 14FRSI W/METER (CATHETERS) ×1 IMPLANT
WATER STERILE IRR 1500ML POUR (IV SOLUTION) ×1 IMPLANT
YANKAUER SUCT BULB TIP NO VENT (SUCTIONS) ×1 IMPLANT

## 2011-12-18 NOTE — Interval H&P Note (Signed)
History and Physical Interval Note:  12/18/2011 8:02 AM  Ricardo Stone  has presented today for surgery, with the diagnosis of abdominal pain, perforated sigmoid diverticulitis.  The various methods of treatment have been discussed with the patient and family. After consideration of risks, benefits and other options for treatment, the patient has consented to    Procedure(s):  EXPLORATORY LAPAROTOMY, COLON RESECTION SIGMOID as a surgical intervention .    The patients' history has been reviewed, patient examined, no change in status, stable for surgery.  I have reviewed the patients' chart and labs.  Questions were answered to the patient's satisfaction.    Velora Heckler, MD, FACS General & Endocrine Surgery [facility]  [name] Judie Petit

## 2011-12-18 NOTE — Brief Op Note (Signed)
12/17/2011 - 12/18/2011  11:18 AM  PATIENT:  Laurina Bustle  45 y.o. male  PRE-OPERATIVE DIAGNOSIS:  perforated diverticulitis  POST-OPERATIVE DIAGNOSIS:  same  PROCEDURE:  Procedure(s): EXPLORATORY LAPAROTOMY COLON RESECTION SIGMOID  SURGEON:  Velora Heckler, MD, FACS  ASSISTANTS:  Avel Peace, MD, FACS   ANESTHESIA:   general per Dr. Eilene Ghazi  EBL:  Total I/O In: 1000 [I.V.:1000] Out: 100 [Urine:100]  BLOOD ADMINISTERED:none  DRAINS: none   LOCAL MEDICATIONS USED:  NONE  SPECIMEN:  Excision  DISPOSITION OF SPECIMEN:  PATHOLOGY  COUNTS:  YES  TOURNIQUET:  * No tourniquets in log *  DICTATION: .Other Dictation: Dictation Number [id number]  PLAN OF CARE: Admit to inpatient   PATIENT DISPOSITION:  PACU - hemodynamically stable.   Delay start of Pharmacological VTE agent (>24hrs) due to surgical blood loss or risk of bleeding:  Yes  Velora Heckler, MD, FACS General & Endocrine Surgery [facility]

## 2011-12-18 NOTE — Transfer of Care (Signed)
Immediate Anesthesia Transfer of Care Note  Patient: Ricardo Stone  Procedure(s) Performed:  EXPLORATORY LAPAROTOMY; COLON RESECTION SIGMOID - with end colostomy  Patient Location: PACU  Anesthesia Type: General  Level of Consciousness: awake and alert   Airway & Oxygen Therapy: Patient Spontanous Breathing and Patient connected to face mask oxygen  Post-op Assessment: Report given to PACU RN and Post -op Vital signs reviewed and stable  Post vital signs: Reviewed and stable  Complications: No apparent anesthesia complications

## 2011-12-18 NOTE — Anesthesia Preprocedure Evaluation (Addendum)
Anesthesia Evaluation  Patient identified by MRN, date of birth, ID band Patient awake    Reviewed: Allergy & Precautions, H&P , NPO status , Patient's Chart, lab work & pertinent test results  Airway Mallampati: II TM Distance: >3 FB Neck ROM: Full    Dental No notable dental hx.    Pulmonary neg pulmonary ROS, Current Smoker,  clear to auscultation  Pulmonary exam normal       Cardiovascular hypertension, + CAD and neg cardio ROS Regular Normal    Neuro/Psych Negative Neurological ROS  Negative Psych ROS   GI/Hepatic negative GI ROS, Neg liver ROS,   Endo/Other  Negative Endocrine ROS  Renal/GU negative Renal ROS  Genitourinary negative   Musculoskeletal negative musculoskeletal ROS (+)   Abdominal   Peds negative pediatric ROS (+)  Hematology negative hematology ROS (+)   Anesthesia Other Findings   Reproductive/Obstetrics negative OB ROS                          Anesthesia Physical Anesthesia Plan  ASA: III and Emergent  Anesthesia Plan: General   Post-op Pain Management:    Induction: Intravenous, Rapid sequence and Cricoid pressure planned  Airway Management Planned: Oral ETT  Additional Equipment:   Intra-op Plan:   Post-operative Plan: Extubation in OR  Informed Consent: I have reviewed the patients History and Physical, chart, labs and discussed the procedure including the risks, benefits and alternatives for the proposed anesthesia with the patient or authorized representative who has indicated his/her understanding and acceptance.   Dental advisory given  Plan Discussed with: CRNA  Anesthesia Plan Comments:        Anesthesia Quick Evaluation

## 2011-12-18 NOTE — Anesthesia Postprocedure Evaluation (Signed)
  Anesthesia Post-op Note  Patient: Ricardo Stone  Procedure(s) Performed:  EXPLORATORY LAPAROTOMY; COLON RESECTION SIGMOID - with end colostomy  Patient Location: PACU  Anesthesia Type: General  Level of Consciousness: awake and alert   Airway and Oxygen Therapy: Patient Spontanous Breathing  Post-op Pain: mild  Post-op Assessment: Post-op Vital signs reviewed, Patient's Cardiovascular Status Stable, Respiratory Function Stable, Patent Airway and No signs of Nausea or vomiting  Post-op Vital Signs: stable  Complications: No apparent anesthesia complications

## 2011-12-18 NOTE — Op Note (Signed)
NAME:  Ricardo Stone, Ricardo Stone NO.:  [id number]  MEDICAL RECORD NO.:  [id number]  LOCATION:  [facility]                         FACILITY:  [facility]  PHYSICIAN:  Velora Heckler, MD      DATE OF BIRTH:  Jan 20, 1966  DATE OF PROCEDURE:  12/18/2011                               OPERATIVE REPORT   PREOPERATIVE DIAGNOSIS:  Perforated sigmoid diverticulitis.  POSTOPERATIVE DIAGNOSIS:  Perforated sigmoid diverticulitis.  PROCEDURE: 1. Exploratory laparotomy. 2. Sigmoid colectomy. 3. End descending colostomy with Hartmann's pouch.  SURGEON:  Velora Heckler, MD, FACS  ASSISTANT:  Adolph Pollack, MD, FACS  ANESTHESIA:  General per Dr. Eilene Ghazi.  ESTIMATED BLOOD LOSS:  Minimal.  PREPARATION:  ChloraPrep.  COMPLICATIONS:  None.  INDICATIONS:  Patient is a 45 year old white male who presented to the emergency department with a 1-week history of abdominal pain.  Over the last 24 hours prior to admission, the pain had escalated and become localized in the lower abdomen.  Patient had noted a few chills, but denies fevers.  White blood cell count was elevated at 19,000.  CT scan, abdomen and pelvis showed findings consistent with perforated diverticulitis with free air within the peritoneum.  Patient was admitted on the General Surgical Service and intravenous antibiotics initiated.  He was prepared and brought to the operating room for laparotomy.  BODY OF REPORT:  Procedure was done in OR #1 at the [facility].  Patient was brought to the operating room, placed in a supine position on the operating room table.  Following administration of general anesthesia, the patient was positioned and then prepped and draped in the usual strict aseptic fashion.  After ascertaining that an adequate level of anesthesia had been achieved, a midline abdominal incision was made with a #10 blade.  Dissection was carried through subcutaneous tissues.  Fascia was incised in the  midline and the peritoneal cavity was entered cautiously.  There was cloudy brown purulent thick fluid present within the peritoneal cavity. Approximately, 200 cc were evacuated.  There were multiple interloop collections, which were broken down manually and evacuated.  There was fibrinous exudate on the sigmoid colon and on the adjacent small bowel. Balfour retractors placed for exposure.  Sigmoid colon was mobilized along its lateral peritoneal attachments.  A point in the proximal sigmoid colon, which is proximal to the area of significant inflammatory changes was selected and transected with a GIA stapler.  Mesentery was divided with the LigaSure.  Dissection was carried distally.  The mid- sigmoid was grossly inflamed and there was an obvious perforation present in the lateral wall of the bowel.  Mesentery was mobilized behind this segment of the colon and divided with the LigaSure. Dissection was carried to the distal sigmoid colon just above the peritoneal reflection.  Bowel was transected at this point with a TA 60 stapler.  Staple line was marked with 2-0 Prolene sutures at either end to facilitate colostomy closure at a later date.  Specimen was submitted to Pathology for review.  Abdomen was copiously irrigated with warm saline, which was evacuated. Good hemostasis was noted throughout.  The distal descending colon and proximal sigmoid colon were mobilized  with the LigaSure to allow adequate length to reach the skin in the left abdominal wall.  An elliptical incision was made on the left abdominal wall and a plug of adipose tissue was excised.  Muscle fascia was divided in a cruciate fashion allowing access to the peritoneal cavity.  Using a Babcock clamp, the proximal sigmoid colon was delivered through the abdominal wall to the skin for maturation to colostomy at the end of the procedure.  Omentum was used to cover the small bowel.  Remaining fluid was evacuated from the  peritoneal cavity.  Midline incision was closed with interrupted #1 Novafil simple sutures.  Subcutaneous tissues irrigated. Skin was approximated at the umbilicus with the stainless steel stapler. Subcutaneous tissues were packed with Betadine-soaked 4 x 4 gauze sponges.  Dry gauze dressings and ABD pads were applied.  Colostomy was matured by excising the staple line.  Colon edges were then secured to the skin circumferentially with interrupted 3-0 Vicryl sutures in the usual fashion.  Colostomy bag was applied.  Patient was awakened from anesthesia and brought to the recovery room. The patient tolerated the procedure well.   Velora Heckler, MD, FACS   TMG/MEDQ  D:  12/18/2011  T:  12/18/2011  Job:  [id number]  cc:   Dayton Bailiff, MD

## 2011-12-18 NOTE — ED Notes (Signed)
Pt unabled to sign consent for surgery, needs to address questions and concerns [id number] with surgeon todd gerkin, surgeon will address questions and concerns in am prior to surgery, will sign consent in the or as per surgeon, rn minnie notified

## 2011-12-19 LAB — CBC
MCH: 31.8 pg (ref 26.0–34.0)
MCHC: 34.2 g/dL (ref 30.0–36.0)
MCV: 92.8 fL (ref 78.0–100.0)
Platelets: 110 10*3/uL — ABNORMAL LOW (ref 150–400)
RDW: 13.8 % (ref 11.5–15.5)

## 2011-12-19 LAB — BASIC METABOLIC PANEL
CO2: 27 mEq/L (ref 19–32)
Calcium: 8.7 mg/dL (ref 8.4–10.5)
Creatinine, Ser: 0.8 mg/dL (ref 0.50–1.35)
Glucose, Bld: 123 mg/dL — ABNORMAL HIGH (ref 70–99)

## 2011-12-19 MED ORDER — METOPROLOL TARTRATE 1 MG/ML IV SOLN
5.0000 mg | Freq: Four times a day (QID) | INTRAVENOUS | Status: DC | PRN
  Administered 2011-12-20: 5 mg via INTRAVENOUS
  Filled 2011-12-19 (×2): qty 5

## 2011-12-19 NOTE — Progress Notes (Signed)
Patient ID: Ricardo Stone, male   DOB: 19-Jan-1966, 45 y.o.   MRN: [id number] 1 Day Post-Op  Subjective: Pt feels pretty good.  C/o some soreness but initial pain much improved.  No nausea.  Tolerating ice chips  Objective: Vital signs in last 24 hours: Temp:  [97.4 F (36.3 C)-98.2 F (36.8 C)] 97.8 F (36.6 C) (12/26 0510) Pulse Rate:  [59-91] 73  (12/26 0547) Resp:  [13-20] 20  (12/26 0510) BP: (120-179)/(85-119) 140/90 mmHg (12/26 0547) SpO2:  [88 %-96 %] 92 % (12/26 0547) Last BM Date: 12/15/11  Intake/Output from previous day: 12/25 0701 - 12/26 0700 In: 4256 [P.O.:2; I.V.:4254] Out: 3505 [Urine:3430; Blood:75] Intake/Output this shift:    PE: Abd: soft, tender, incision c/d/i.  Part of superior and inferior wound packed.  Ostomy with no feculent output or flatus.  Stoma pink and viable.  Lab Results:   Basename 12/19/11 0415 12/17/11 2050  WBC 20.9* 19.6*  HGB 14.2 16.1  HCT 41.5 47.1  PLT 110* 137*   BMET  Basename 12/19/11 0415 12/17/11 2050  NA 133* 137  K 4.9 3.9  CL 102 104  CO2 27 25  GLUCOSE 123* 127*  BUN 8 11  CREATININE 0.80 0.88  CALCIUM 8.7 9.6   PT/INR No results found for this basename: LABPROT:2,INR:2 in the last 72 hours   Studies/Results: Ct Abdomen Pelvis W Contrast  12/17/2011  *RADIOLOGY REPORT*  Clinical Data: Pelvic pain.  Evaluate for diverticulitis.  CT ABDOMEN AND PELVIS WITH CONTRAST  Technique:  Multidetector CT imaging of the abdomen and pelvis was performed following the standard protocol during bolus administration of intravenous contrast.  Contrast: OMNIPAQUE IOHEXOL 300 MG/ML IV SOLN  Comparison: None.  Findings: There is intra-abdominal free air present compatible with perforated viscus.  Inflammatory changes are present in the sigmoid colon with gas tracking in the small bowel mesentery.  There is adjacent enteritis of a loop of small bowel.  No abscess is identified.  Incidental visualization of the lung bases  demonstrates coronary artery atherosclerosis.  The liver appears within normal limits. The gallbladder appears normal.  No calcified gallstones.  Pancreas appears normal.  The spleen normal.  Stomach distended with oral contrast.  Proximal small bowel appears normal.  Aortic atherosclerosis without aneurysm.  Normal renal enhancement and excretion of contrast bilaterally.  The rectum appears normal.  Prostate gland and urinary bladder are normal.  Distal sigmoid diverticulosis.  Normal appendix.  The appendix is immediately adjacent to the inflammatory changes of the sigmoid colon.  Although it is difficult to completely exclude perforated tip appendicitis, perforated diverticulitis is more likely.  The amount of free air would be a typical with perforated appendicitis and the more proximal appendix extending to the cecum appears normal.  No aggressive osseous lesions are present. Discogenic sclerosis is present in the superior L3 endplate. Mildly exaggerated thoracolumbar kyphosis.  IMPRESSION: 1.  Free intra-abdominal air compatible with perforated viscus. Critical Value/emergent results were called by telephone at the time of interpretation on 12/17/2011  at 2211 hours  to  Dr. Brooke Dare, who verbally acknowledged these results. 2.  Source of perforation is favored represent perforated sigmoid diverticulitis.  Air-filled appendix is present nearby however this degree of free intraperitoneal air would be unusual for appendicitis.  Phlegmon is present in the adjacent mesentery without abscess. 3.  Inflammatory changes of the loop of small bowel nearby at the site of suspected perforation. 4.  Atherosclerosis and coronary artery disease.  Original Report  Authenticated By: Andreas Newport, M.D.    Anti-infectives: Anti-infectives     Start     Dose/Rate Route Frequency Ordered Stop   12/19/11 0900   ertapenem (INVANZ) 1 g in sodium chloride 0.9 % 50 mL IVPB        1 g 100 mL/hr over 30 Minutes Intravenous Every 24  hours 12/18/11 1524     12/18/11 2200   ertapenem (INVANZ) 1 g in sodium chloride 0.9 % 50 mL IVPB  Status:  Discontinued        1 g 100 mL/hr over 30 Minutes Intravenous Every 24 hours 12/17/11 2338 12/18/11 1509   12/18/11 2200   ertapenem (INVANZ) 1 g in sodium chloride 0.9 % 50 mL IVPB  Status:  Discontinued        1 g 100 mL/hr over 30 Minutes Intravenous Every 24 hours 12/18/11 1509 12/18/11 1523   12/17/11 2215   ertapenem (INVANZ) 1 g in sodium chloride 0.9 % 50 mL IVPB        1 g 100 mL/hr over 30 Minutes Intravenous  Once 12/17/11 2207 12/18/11 0932           Assessment/Plan  1. Perf tics 2. S/p Hartmann's procedure with colostomy 3. HTN  Plan: Cont on ice chips as pt has nothing in his bag and has no bowel sounds pulm toilet and mobilize Cont foley, d/c in am WOC, RN consult for ostomy care Begin NS WD dressing changes BID to wound  Add prn HTN meds  LOS: 2 days    Swan Fairfax E 12/19/2011

## 2011-12-19 NOTE — Progress Notes (Signed)
Getting ready to go for a walk, seems stable. Agree with A&P of KO, PA

## 2011-12-19 NOTE — Consult Note (Signed)
WOC ostomy consult  Stoma type/location:  LLQ, end colostomy  Stomal assessment/size:  1 3/4" round, pink and moist visualized thru pouch  Peristomal assessment: pouch not changed today due to pain and sleepiness secondary to pain meds  Output : no fecal output or flatus yet  Ostomy pouching: 2pc. Based on present assessment, would recommend 2pc for pt, left in room and had hands on with pt to see this type of pouching.    Education provided: Colostomy education booklet left with pt and started education on self care of ostomy.  Discussed pouching options with pt and had pt visualize 2pc with lock and roll closure, pt very sleepy during teaching session and just up in chair, so pain an issue at current time.  WOC team will follow.  Encouraged pt to review ostomy booklet to be prepared for [facility] nurse visits for further education and participation in pouch change in next couple of days.   WOC team will follow. Thanks  Keyosha Tiedt [employer], CWOCN [phone])

## 2011-12-20 ENCOUNTER — Encounter (HOSPITAL_COMMUNITY): Payer: Self-pay | Admitting: Surgery

## 2011-12-20 LAB — CBC
MCH: 32.1 pg (ref 26.0–34.0)
MCHC: 34.6 g/dL (ref 30.0–36.0)
Platelets: 123 10*3/uL — ABNORMAL LOW (ref 150–400)
RBC: 4.39 MIL/uL (ref 4.22–5.81)

## 2011-12-20 LAB — BASIC METABOLIC PANEL
Calcium: 8.4 mg/dL (ref 8.4–10.5)
GFR calc non Af Amer: >90 mL/min (ref >90)
Sodium: 135 mEq/L (ref 135–145)

## 2011-12-20 NOTE — Progress Notes (Signed)
2 Days Post-Op  Subjective: Ambulating OK, pain under good control, no nausea, still NPO. No output via colostomy yet  Objective: Vital signs in last 24 hours: Temp:  [97.7 F (36.5 C)-98.5 F (36.9 C)] 98 F (36.7 C) (12/27 0620) Pulse Rate:  [68-80] 76  (12/27 0650) Resp:  [18] 18  (12/27 0620) BP: (133-182)/(92-104) 158/100 mmHg (12/27 0725) SpO2:  [90 %-98 %] 96 % (12/27 0620) Last BM Date: 12/15/11  Intake/Output from previous day: 12/26 0701 - 12/27 0700 In: 3188 [I.V.:3188] Out: 2200 [Urine:2200] Intake/Output this shift:    General appearance: alert and fatigued Resp: clear to auscultation bilaterally Cardio: regular rate and rhythm, S1, S2 normal, no murmur, click, rub or gallop GI: Abdomen soft, but some incisional tenderness, no rebound, ostomy pink Incision/Wound: Clean and packing intact. No purulence yet. Lab Results:   Basename 12/20/11 0421 12/19/11 0415  WBC 15.1* 20.9*  HGB 14.1 14.2  HCT 40.7 41.5  PLT 123* 110*   BMET  Basename 12/20/11 0421 12/19/11 0415  NA 135 133*  K 4.0 4.9  CL 101 102  CO2 26 27  GLUCOSE 116* 123*  BUN 10 8  CREATININE 0.89 0.80  CALCIUM 8.4 8.7   PT/INR No results found for this basename: LABPROT:2,INR:2 in the last 72 hours ABG No results found for this basename: PHART:2,PCO2:2,PO2:2,HCO3:2 in the last 72 hours  Studies/Results: No results found.  Anti-infectives: Anti-infectives     Start     Dose/Rate Route Frequency Ordered Stop   12/19/11 0900   ertapenem (INVANZ) 1 g in sodium chloride 0.9 % 50 mL IVPB        1 g 100 mL/hr over 30 Minutes Intravenous Every 24 hours 12/18/11 1524     12/18/11 2200   ertapenem (INVANZ) 1 g in sodium chloride 0.9 % 50 mL IVPB  Status:  Discontinued        1 g 100 mL/hr over 30 Minutes Intravenous Every 24 hours 12/17/11 2338 12/18/11 1509   12/18/11 2200   ertapenem (INVANZ) 1 g in sodium chloride 0.9 % 50 mL IVPB  Status:  Discontinued        1 g 100 mL/hr over 30  Minutes Intravenous Every 24 hours 12/18/11 1509 12/18/11 1523   12/17/11 2215   ertapenem (INVANZ) 1 g in sodium chloride 0.9 % 50 mL IVPB        1 g 100 mL/hr over 30 Minutes Intravenous  Once 12/17/11 2207 12/18/11 0932          Assessment/Plan: s/p Procedure(s): EXPLORATORY LAPAROTOMY COLON RESECTION SIGMOID Continue NPO and IV's. Await cultures and bowel function to return.  LOS: 3 days    Ricardo Stone J 12/20/2011

## 2011-12-20 NOTE — Consult Note (Addendum)
Ostomy follow-up:  Colostomy pouch intact with good seal. Small amt brown liquid stool  in pouch at present.  Stoma visualized through pouch red and viable. Pt still on ice chips and c/o mod amt discomfort; does not appear ready for pouch change session at this time.  WOC team will follow.   Cammie Mcgee, RN, MSN, [employer]  [phone]

## 2011-12-21 NOTE — Progress Notes (Signed)
Patient ID: Ricardo Stone, male   DOB: 1966-10-19, 45 y.o.   MRN: [id number] 3 Days Post-Op  Subjective: Pt feels well.  No nausea.  No output in ostomy yet.  Did feel like he may be having some gas pains.  Objective: Vital signs in last 24 hours: Temp:  [97.7 F (36.5 C)-97.9 F (36.6 C)] 97.7 F (36.5 C) (12/28 0600) Pulse Rate:  [64-67] 67  (12/28 0600) Resp:  [18-20] 18  (12/28 0600) BP: (145-158)/(78-105) 145/98 mmHg (12/28 0600) SpO2:  [95 %-96 %] 96 % (12/28 0600) Last BM Date: 12/15/11  Intake/Output from previous day: 12/27 0701 - 12/28 0700 In: 2887 [I.V.:2887] Out: 2200 [Urine:2200] Intake/Output this shift:    PE: Abd: soft, tender, incision clean and wounds are clean and packed. -BS, ND, ostomy without any output yet.  Stoma is pink and viable.  Lab Results:   Basename 12/20/11 0421 12/19/11 0415  WBC 15.1* 20.9*  HGB 14.1 14.2  HCT 40.7 41.5  PLT 123* 110*   BMET  Basename 12/20/11 0421 12/19/11 0415  NA 135 133*  K 4.0 4.9  CL 101 102  CO2 26 27  GLUCOSE 116* 123*  BUN 10 8  CREATININE 0.89 0.80  CALCIUM 8.4 8.7   PT/INR No results found for this basename: LABPROT:2,INR:2 in the last 72 hours   Studies/Results: No results found.  Anti-infectives: Anti-infectives     Start     Dose/Rate Route Frequency Ordered Stop   12/19/11 0900   ertapenem (INVANZ) 1 g in sodium chloride 0.9 % 50 mL IVPB        1 g 100 mL/hr over 30 Minutes Intravenous Every 24 hours 12/18/11 1524     12/18/11 2200   ertapenem (INVANZ) 1 g in sodium chloride 0.9 % 50 mL IVPB  Status:  Discontinued        1 g 100 mL/hr over 30 Minutes Intravenous Every 24 hours 12/17/11 2338 12/18/11 1509   12/18/11 2200   ertapenem (INVANZ) 1 g in sodium chloride 0.9 % 50 mL IVPB  Status:  Discontinued        1 g 100 mL/hr over 30 Minutes Intravenous Every 24 hours 12/18/11 1509 12/18/11 1523   12/17/11 2215   ertapenem (INVANZ) 1 g in sodium chloride 0.9 % 50 mL IVPB        1  g 100 mL/hr over 30 Minutes Intravenous  Once 12/17/11 2207 12/18/11 0932           Assessment/Plan  1. S/p Hartmann's for perf tics 2. Post-op ileus 3. HTN  Plan: Will allow sips of clears today. Cont prn meds for BP Cont ambulation and await bowel function Cont dressing changes   LOS: 4 days    Jashon Ishida E 12/21/2011

## 2011-12-21 NOTE — Progress Notes (Signed)
Seems to be progressing - abd soft. No output yet but believe he can start liquids. Path showed acute diverticulitis and I discussed that with him

## 2011-12-21 NOTE — Consult Note (Signed)
WOC ostomy consult  Stoma type/location: LLQ Colostomy Stomal assessment/size: generally round (1 and 1/2 inch with slight elongation at 3 and 9 o'clock), red, moist, budded stoma with os at dead center. Peristomal assessment: clear Treatment options for stomal/peristomal skin: none indicated Output none Ostomy pouching: 2pc. 2 and 1/4inch [employer] system with [employer]. Education provided: Patient understands WOC Nurse role in post operative course.  Has not yet reviewed teaching materials, but will today or this evening in anticipation of WOC nurse visit tomorrow. Briefly viewed stoma during pouch change and asked appropriate question, but had just received anti-emetic and is keeping eyes closed.  I will work with patient over weekend.  Ileus persists, so teaching regarding emptying will have to wait.  I'll follow with you. Thanks, Ladona Mow, MSN, RN, [facility], CWOCN [phone])

## 2011-12-22 LAB — CBC
Platelets: 207 10*3/uL (ref 150–400)
RBC: 4.62 MIL/uL (ref 4.22–5.81)
WBC: 11.7 10*3/uL — ABNORMAL HIGH (ref 4.0–10.5)

## 2011-12-22 NOTE — Progress Notes (Signed)
Patient ID: Ricardo Stone, male   DOB: 29-Jun-1966, 45 y.o.   MRN: [id number] 4 Days Post-Op  Subjective: Feel steadily better. No particular complaints today. No nausea. Passing a lot of gas per colostomy but no stool. Abdomen seems less distended to him.  Objective: Vital signs in last 24 hours: Temp:  [97.6 F (36.4 C)-98 F (36.7 C)] 98 F (36.7 C) (12/29 0620) Pulse Rate:  [62-69] 62  (12/29 0620) Resp:  [18] 18  (12/29 0620) BP: (133-148)/(88-89) 133/89 mmHg (12/29 0620) SpO2:  [96 %-97 %] 97 % (12/29 0620) Last BM Date: 12/15/11  Intake/Output from previous day: 12/28 0701 - 12/29 0700 In: 1516.7 [I.V.:1516.7] Out: 2800 [Urine:2800] Intake/Output this shift:    General appearance: alert and no distress GI: normal findings: soft, non-tender Incision/Wound:wound just redressed. Dressing clean and dry. Stoma appears healthy.  Lab Results:   Basename 12/22/11 0440 12/20/11 0421  WBC 11.7* 15.1*  HGB 14.5 14.1  HCT 41.4 40.7  PLT 207 123*   BMET  Basename 12/20/11 0421  NA 135  K 4.0  CL 101  CO2 26  GLUCOSE 116*  BUN 10  CREATININE 0.89  CALCIUM 8.4   PT/INR No results found for this basename: LABPROT:2,INR:2 in the last 72 hours ABG No results found for this basename: PHART:2,PCO2:2,PO2:2,HCO3:2 in the last 72 hours  Studies/Results: No results found.  Anti-infectives: Anti-infectives     Start     Dose/Rate Route Frequency Ordered Stop   12/19/11 0900   ertapenem (INVANZ) 1 g in sodium chloride 0.9 % 50 mL IVPB        1 g 100 mL/hr over 30 Minutes Intravenous Every 24 hours 12/18/11 1524     12/18/11 2200   ertapenem (INVANZ) 1 g in sodium chloride 0.9 % 50 mL IVPB  Status:  Discontinued        1 g 100 mL/hr over 30 Minutes Intravenous Every 24 hours 12/17/11 2338 12/18/11 1509   12/18/11 2200   ertapenem (INVANZ) 1 g in sodium chloride 0.9 % 50 mL IVPB  Status:  Discontinued        1 g 100 mL/hr over 30 Minutes Intravenous Every 24 hours  12/18/11 1509 12/18/11 1523   12/17/11 2215   ertapenem (INVANZ) 1 g in sodium chloride 0.9 % 50 mL IVPB        1 g 100 mL/hr over 30 Minutes Intravenous  Once 12/17/11 2207 12/18/11 0932          Assessment/Plan: s/p Procedure(s): EXPLORATORY LAPAROTOMY COLON RESECTION SIGMOID Progressing well. Will start clear liquid diet. Continue IV antibiotics.   LOS: 5 days    Alejos Reinhardt T 12/22/2011

## 2011-12-23 NOTE — Progress Notes (Signed)
Patient ID: Ricardo Stone, male   DOB: Nov 26, 1966, 45 y.o.   MRN: [id number] 5 Days Post-Op  Subjective: Up walking in the halls, appears well. Denies pain or nausea. Tolerating clear liquids. He has gas but minimal stool per colostomy.  Objective: Vital signs in last 24 hours: Temp:  [97.8 F (36.6 C)-98.5 F (36.9 C)] 97.8 F (36.6 C) (12/30 0459) Pulse Rate:  [56-62] 60  (12/30 0459) Resp:  [18] 18  (12/30 0459) BP: (130-138)/(80-95) 138/89 mmHg (12/30 0459) SpO2:  [97 %-100 %] 100 % (12/30 0459) Last BM Date: 12/15/11  Intake/Output from previous day: 12/29 0701 - 12/30 0700 In: 1873.8 [I.V.:1673.8; IV Piggyback:200] Out: 2575 [Urine:2575] Intake/Output this shift: Total I/O In: 320 [P.O.:320] Out: -   General appearance: alert and no distress GI: normal findings: soft, non-tender Incision/Wound: wound clean with small areas packed open. No evidence of infection. Stoma appears healthy.  Lab Results:   [facility] 12/22/11 0440  WBC 11.7*  HGB 14.5  HCT 41.4  PLT 207   BMET No results found for this basename: NA:2,K:2,CL:2,CO2:2,GLUCOSE:2,BUN:2,CREATININE:2,CALCIUM:2 in the last 72 hours PT/INR No results found for this basename: LABPROT:2,INR:2 in the last 72 hours ABG No results found for this basename: PHART:2,PCO2:2,PO2:2,HCO3:2 in the last 72 hours  Studies/Results: No results found.  Anti-infectives: Anti-infectives     Start     Dose/Rate Route Frequency Ordered Stop   12/19/11 0900   ertapenem (INVANZ) 1 g in sodium chloride 0.9 % 50 mL IVPB        1 g 100 mL/hr over 30 Minutes Intravenous Every 24 hours 12/18/11 1524     12/18/11 2200   ertapenem (INVANZ) 1 g in sodium chloride 0.9 % 50 mL IVPB  Status:  Discontinued        1 g 100 mL/hr over 30 Minutes Intravenous Every 24 hours 12/17/11 2338 12/18/11 1509   12/18/11 2200   ertapenem (INVANZ) 1 g in sodium chloride 0.9 % 50 mL IVPB  Status:  Discontinued        1 g 100 mL/hr over 30 Minutes  Intravenous Every 24 hours 12/18/11 1509 12/18/11 1523   12/17/11 2215   ertapenem (INVANZ) 1 g in sodium chloride 0.9 % 50 mL IVPB        1 g 100 mL/hr over 30 Minutes Intravenous  Once 12/17/11 2207 12/18/11 0932          Assessment/Plan: s/p Procedure(s): EXPLORATORY LAPAROTOMY COLON RESECTION SIGMOID Doing well. We'll advance diet. Continue antibiotics and recheck CBC tomorrow. Likely discharge next one to 2 days.   LOS: 6 days    [name] T 12/23/2011

## 2011-12-23 NOTE — Consult Note (Signed)
WOC ostomy consult  Patient seen in follow up.  Able to empty pouch of flatus without assist. No stool in pouch, but pouch is new today.  Patient states that he changed it this am using template provided by me at our last visit. On inspection, appears to be a good fit, although edges of aperture are somewhat "rough".  Patient has read educational booklet and is asking appropriate questions such as "do I have a descending colostomy" and questions relative to diet and exercise.  Patient taught that I will establish him with a post acute care supply sampling program which will expose him to closed-end pouches and opaque pouches with an integrated gas filter. I will plan a visit for tomorrow am so that we can go over pouch emptying into the commode and cleaning out the bottom of the pouch prior to closing. While he seems independent and very motivated, I am somewhat concerned that he will go home with minimal support.  His significant other has already indicated to me that she does not wish to be involved in his wound or ostomy care and his son, albeit approximately 8 years of age, is unable to provide this level of care for his father.  If you agree, please order or consider home health nurse for post discharge follow up. Thanks. Ladona Mow, MSN, RN, GNP, CWOCN [phone])

## 2011-12-24 ENCOUNTER — Encounter (HOSPITAL_COMMUNITY): Payer: Self-pay | Admitting: Surgery

## 2011-12-24 DIAGNOSIS — Z87891 Personal history of nicotine dependence: Secondary | ICD-10-CM

## 2011-12-24 DIAGNOSIS — K9189 Other postprocedural complications and disorders of digestive system: Secondary | ICD-10-CM

## 2011-12-24 DIAGNOSIS — I10 Essential (primary) hypertension: Secondary | ICD-10-CM

## 2011-12-24 DIAGNOSIS — I252 Old myocardial infarction: Secondary | ICD-10-CM

## 2011-12-24 DIAGNOSIS — K567 Ileus, unspecified: Secondary | ICD-10-CM

## 2011-12-24 LAB — CBC
HCT: 41.6 % (ref 39.0–52.0)
MCV: 90.4 fL (ref 78.0–100.0)
Platelets: 267 10*3/uL (ref 150–400)
RBC: 4.6 MIL/uL (ref 4.22–5.81)
RDW: 12.9 % (ref 11.5–15.5)
WBC: 11.5 10*3/uL — ABNORMAL HIGH (ref 4.0–10.5)

## 2011-12-24 MED ORDER — AMOXICILLIN-POT CLAVULANATE 875-125 MG PO TABS
1.0000 | ORAL_TABLET | Freq: Two times a day (BID) | ORAL | Status: DC
  Administered 2011-12-25: 1 via ORAL
  Filled 2011-12-24 (×2): qty 1

## 2011-12-24 MED ORDER — OXYCODONE-ACETAMINOPHEN 5-325 MG PO TABS
1.0000 | ORAL_TABLET | ORAL | Status: DC | PRN
  Administered 2011-12-24: 1 via ORAL
  Filled 2011-12-24 (×2): qty 1

## 2011-12-24 NOTE — Progress Notes (Signed)
12/24/11, Kathi Der RNC-MNN, BSN, [phone].  CM received referral.  CM spoke to pt about HH needs.  Order is for twice a day dressing changes, ostomy support-wet to dry dressing chnages. Spoke with Darl Pikes at [facility] and they are able to provide services.  Discussed community resourses available to pt. since pt is self pay.  Discussed $4 medications available at [facility].  Pt also provided with [employer] card for discount on medications.  Also gave pt list of self pay places that provide medical care-[facility], [facility],etc.  Pt states that he currently sees a PA at [facility].  Pt appreciative of information.  I offered to schedule an appt. with [facility] if he would like.  Will follow for additional needs/orders.  Pt lives alone, but has an 64 yo son that can help as needed.

## 2011-12-24 NOTE — Progress Notes (Signed)
Patient ID: Ricardo Stone, male   DOB: [birth year], 45 y.o.   MRN: [id number] 6 Days Post-Op  Subjective: Didn't eat much for supper.  Now up to soft diet this AM.  Some liquid thru colostomy, no solid so far.  He lives with 55 y/o son.  Still on Invanz. Overall doing well.      Objective: Vital signs in last 24 hours: Temp:  [97.4 F (36.3 C)-97.9 F (36.6 C)] 97.9 F (36.6 C) (12/31 0454) Pulse Rate:  [50-61] 50  (12/31 0454) Resp:  [19-20] 20  (12/31 0454) BP: (112-145)/(76-90) 112/76 mmHg (12/31 0454) SpO2:  [96 %-97 %] 97 % (12/31 0454) Last BM Date: 12/15/11  Intake/Output from previous day: 12/30 0701 - 12/31 0700 In: 1793.3 [P.O.:560; I.V.:1183.3; IV Piggyback:50] Out: 1200 [Urine:1200] Intake/Output this shift:    General appearance: alert and no distress GI: normal findings: soft, non-tender Incision/Wound: wound clean with small areas packed open. No evidence of infection. Stoma appears healthy. Lungs: clear to auscultation. Lab Results:   [facility] 12/24/11 0409 12/22/11 0440  WBC 11.5* 11.7*  HGB 14.5 14.5  HCT 41.6 41.4  PLT 267 207   BMET No results found for this basename: NA:2,K:2,CL:2,CO2:2,GLUCOSE:2,BUN:2,CREATININE:2,CALCIUM:2 in the last 72 hours PT/INR No results found for this basename: LABPROT:2,INR:2 in the last 72 hours ABG No results found for this basename: PHART:2,PCO2:2,PO2:2,HCO3:2 in the last 72 hours  Studies/Results: No results found.  Anti-infectives: Anti-infectives     Start     Dose/Rate Route Frequency Ordered Stop   12/19/11 0900   ertapenem (INVANZ) 1 g in sodium chloride 0.9 % 50 mL IVPB        1 g 100 mL/hr over 30 Minutes Intravenous Every 24 hours 12/18/11 1524     12/18/11 2200   ertapenem (INVANZ) 1 g in sodium chloride 0.9 % 50 mL IVPB  Status:  Discontinued        1 g 100 mL/hr over 30 Minutes Intravenous Every 24 hours 12/17/11 2338 12/18/11 1509   12/18/11 2200   ertapenem (INVANZ) 1 g in sodium chloride 0.9  % 50 mL IVPB  Status:  Discontinued        1 g 100 mL/hr over 30 Minutes Intravenous Every 24 hours 12/18/11 1509 12/18/11 1523   12/17/11 2215   ertapenem (INVANZ) 1 g in sodium chloride 0.9 % 50 mL IVPB        1 g 100 mL/hr over 30 Minutes Intravenous  Once 12/17/11 2207 12/18/11 0932          Assessment/Plan: s/p Procedure(s): EXPLORATORY LAPAROTOMY COLON RESECTION SIGMOID Patient Active Problem List  Diagnoses  . Diverticulitis of colon with perforation  . Ileus following gastrointestinal surgery  . MI, old  . Hypertension   Plan:  Started soft diet today, will be sure Home health is set up and he's got support at home if he needs it.  Will need wet to dry dressings, BID, and ostomy care.  Hope to send home tomorrow. Decide on oral abx, and duration.   LOS: 7 days    Aurorah Schlachter 12/24/2011

## 2011-12-24 NOTE — Consult Note (Signed)
WOC ostomy consult  Stoma type/location: LLQ Colostomy Stomal assessment/size: 1 and 1/2 inches round Peristomal assessment: clear Treatment options for stomal/peristomal skin: Dry Powder Shave Technique demonstrated for patient as he has had significant hair re-growth since surgery. Output scant amount thin bloody/brown drainage only Ostomy pouching: 2pc. (2 and 1/4 inch) system. Lock and [employer] provided: patient taught to clean bottom of pouch closure using toilet paper "wicks".  Pouching system changed today; demonstration of re-sizing provided.  Supply provided for home use (2 weeks).  Patient will be established with [facility] [employer].  I will not see tomorrow.  If patient is still here on 12/26/11, I will see prior to discharge. Thanks. Ladona Mow, MSN, RN, GNP, CWOCN [phone])

## 2011-12-25 LAB — CREATININE, SERUM
GFR calc Af Amer: >90 mL/min (ref >90)
GFR calc non Af Amer: >90 mL/min (ref >90)

## 2011-12-25 MED ORDER — OXYCODONE-ACETAMINOPHEN 5-325 MG PO TABS: 1.0000 | ORAL_TABLET | ORAL | Status: AC | PRN

## 2011-12-25 MED ORDER — AMOXICILLIN-POT CLAVULANATE 875-125 MG PO TABS: 1.0000 | ORAL_TABLET | Freq: Two times a day (BID) | ORAL | Status: AC

## 2011-12-25 NOTE — Progress Notes (Signed)
12-25-11 Received call from unit secretary stating patient was about to discharge and needed to be seen by CM prior to dc. Patient has already been set up with [facility] for [facility] RN for wound care. Faxed dc summary to fax number [phone] and called [facility] to make aware that patient was being discharged today.   [place], [place] [phone]

## 2011-12-25 NOTE — Discharge Summary (Signed)
Physician Discharge Summary  Patient ID: Ricardo Stone MRN: [id number] DOB/AGE: 07-15-1966 46 y.o.  Admit date: 12/17/2011 Discharge date: 12/25/2011  Admission Diagnoses: Perforated sigmoid diverticulitis H/o HTN  Discharge Diagnoses:  Principal Problem:  *Diverticulitis of colon with perforation Active Problems:  Ileus following gastrointestinal surgery  MI, old  Hypertension  History of tobacco use   Discharged Condition: good  Hospital Course: 46 year old Caucasian male presented to the emergency department with one week history of abdominal pain which worsened one day prior to the presentation. In the emergency room he was found to have an elevated white blood cell count. A CT scan was performed which demonstrated free air along with perforated sigmoid diverticulitis. IV fluids and IV antibiotics were initiated. He was taken to the operating room the next morning for exploratory laparotomy, sigmoid colectomy and end colostomy. Postoperatively, he was continued on IV antibiotics. Eventually his bowel function recovered after a short ileus. 2 areas of his surgical incision were opened and packed with wet-to-dry gauze. On the day of discharge he was tolerating a regular diet. He was comfortable with this ostomy care. His ostomy was functional. His vital signs are stable.  Consults: wound care/ostomy nurse  Significant Diagnostic Studies: labs: elevated WBC on admission and radiology: CT scan: On December 24 showed free intra-abdominal air, consistent with perforated sigmoid colon along with a phlegmon  Treatments: IV hydration, antibiotics: invanz then augmentin and surgery: Exp lap, sigmoid colectomy with end colostomy 12/18/11  Discharge Exam: Blood pressure 137/87, pulse 65, temperature 97.7 F (36.5 C), temperature source Oral, resp. rate 18, height 5\' 9"  (1.753 m), weight 178 lb (80.74 kg), SpO2 98.00%. Alert, NAD, sitting in chair CTA Regular Soft, nt, very minimal  distension. Ostomy LLQ. Midline incision - opened in 2 separate places o/w staples intact. 2 open wound areas - clean, viable tissue, fascia intact; no cellulitis  Disposition:   Discharge Orders    Future Orders Please Complete By Expires   Diet - low sodium heart healthy      Increase activity slowly      Lifting restrictions      Comments:   No lifting, pushing, pulling anything >15 pounds x 6 weeks   Driving Restrictions      Comments:   No driving while taking pain meds   Discharge wound care:      Comments:   Wet-dry dressing to 2 open midline wounds twice a day. Moisten gauze & place in wound, cover with dry gauze twice a day  Ostomy care as instructed     Medication List  As of 12/25/2011 11:05 AM   START taking these medications         amoxicillin-clavulanate 875-125 MG per tablet   Commonly known as: AUGMENTIN   Take 1 tablet by mouth every 12 (twelve) hours.      oxyCODONE-acetaminophen 5-325 MG per tablet   Commonly known as: PERCOCET   Take 1-2 tablets by mouth every 4 (four) hours as needed.          Where to get your medications    These are the prescriptions that you need to pick up.   You may get these medications from any pharmacy.         amoxicillin-clavulanate 875-125 MG per tablet   oxyCODONE-acetaminophen 5-325 MG per tablet           Follow-up Information    Follow up with Velora Heckler, MD in 8 days. (To be seen for staple removal,  and to look at wound.  01/01/12 would be 14 days.  Try to get in then if possible.)    Contact information:   [facility] [address], [address] [place] [address] [phone]          Mary Sella. Andrey Campanile, MD, FACS General, Bariatric, & Minimally Invasive Surgery [facility], [place]  Signed: Atilano Ina 12/25/2011, 11:05 AM

## 2011-12-31 ENCOUNTER — Ambulatory Visit (INDEPENDENT_AMBULATORY_CARE_PROVIDER_SITE_OTHER): Payer: Self-pay | Admitting: Surgery

## 2011-12-31 ENCOUNTER — Encounter (INDEPENDENT_AMBULATORY_CARE_PROVIDER_SITE_OTHER): Payer: Self-pay | Admitting: Surgery

## 2011-12-31 VITALS — BP 152/82 | HR 70 | Temp 97.7°F | Resp 14 | Ht 69.0 in | Wt 169.8 lb

## 2011-12-31 DIAGNOSIS — K5720 Diverticulitis of large intestine with perforation and abscess without bleeding: Secondary | ICD-10-CM

## 2011-12-31 DIAGNOSIS — K5732 Diverticulitis of large intestine without perforation or abscess without bleeding: Secondary | ICD-10-CM

## 2011-12-31 NOTE — Patient Instructions (Signed)
Continue dressing changes twice daily.  Shower once daily.  tmg

## 2011-12-31 NOTE — Progress Notes (Signed)
Visit Diagnoses: 1. Diverticulitis of colon with perforation     HISTORY: Patient returns for wound check having undergone an emergent laparotomy with sigmoid colectomy and descending colostomy for perforated sigmoid diverticulitis on Christmas day.  EXAM: Abdominal wound shows 2 areas which are open and being treated with wet-to-dry dressing changes. They are healing by secondary intention without complication. Staples in the surgical site incision are removed. Colostomy is pink and viable and functioning normally.  IMPRESSION: Perforated sigmoid diverticulitis, status post colostomy  PLAN: Patient will continue twice daily dressing changes. He will return in 2-3 weeks for a wound check. He will require a colonoscopy in a few months. We will complete that study prior to colostomy closure.  Velora Heckler, MD, FACS General & Endocrine Surgery [facility]

## 2012-01-10 ENCOUNTER — Telehealth (INDEPENDENT_AMBULATORY_CARE_PROVIDER_SITE_OTHER): Payer: Self-pay | Admitting: General Surgery

## 2012-01-10 NOTE — Telephone Encounter (Signed)
MR Ricardo Stone CALLED EARLIER TODAY TO REPORT NEW OCCURRENCES WITH PAST 2 DAYS/ HIS WOUND FELT MORE IRRITATING INSIDE PACKED AREA/ NO CHANGE IN DRAINAGE/ NO REDNESS OR DISCOLORATION AROUND SKIN EDGES NOTED/ ALSO HE HAD EXPERIENCED NIGHT SWEATS  FOR PAST TWO EVENINGS/ TEMP WAS 99 WHEN CHECKED. PT IS EATING OK NO NAUSEA OR VOMITING, COLOSTOMY FUNCTIONING OK. HE IS CHANGING PACKING TWICE A DAY/ I REVIEWED THIS WITH DR. Nat Stone AND HE SAID NIGHT SWEATS CAN OCCUR AFTER THIS TYPE OF SURGERY AND THAT THE WOUND MAY BE GRANULATING IN CAUSING A FEELING OF IRRITATION. HE SUGGESTED THAT MR Ricardo Stone MONITOR HIS TEMPERATURE AND REPORT ANY CHANGE IN SYMPTOMS RE DRAINAGE FROM WOUND ETC. PT NOTIFIED. HE WILL CALL IF HE HAS OTHER QUESTIONS/GY

## 2012-01-24 ENCOUNTER — Encounter (INDEPENDENT_AMBULATORY_CARE_PROVIDER_SITE_OTHER): Payer: Self-pay | Admitting: Surgery

## 2012-01-24 ENCOUNTER — Ambulatory Visit (INDEPENDENT_AMBULATORY_CARE_PROVIDER_SITE_OTHER): Payer: Self-pay | Admitting: Surgery

## 2012-01-24 VITALS — BP 144/98 | HR 70 | Temp 97.6°F | Resp 18 | Ht 69.0 in | Wt 171.4 lb

## 2012-01-24 DIAGNOSIS — K5732 Diverticulitis of large intestine without perforation or abscess without bleeding: Secondary | ICD-10-CM

## 2012-01-24 DIAGNOSIS — K5720 Diverticulitis of large intestine with perforation and abscess without bleeding: Secondary | ICD-10-CM

## 2012-01-24 NOTE — Patient Instructions (Signed)
Apply anitbiotic ointment to wounds twice daily.   COCOA BUTTER & VITAMIN E CREAM  (Palmer's or other brand)  Apply cocoa butter/vitamin E cream to your incision 2 - 3 times daily.  Massage cream into incision for one minute with each application.  Use sunscreen (50 SPF or higher) for first 6 months after surgery if area is exposed to sun.  You may substitute Mederma or other scar reducing creams as desired.

## 2012-01-24 NOTE — Progress Notes (Signed)
Visit Diagnoses: 1. Diverticulitis of colon with perforation     HISTORY: Patient is a 46 year old white male who underwent urgent sigmoid colectomy with descending colostomy due to diverticulitis with perforation in December 2012. He has been doing dressing changes to the midline incision. He returns today for followup.  PERTINENT REVIEW OF SYSTEMS: The patient notes good appetite. He has no abdominal pain.  EXAM: HEENT: normocephalic; pupils equal and reactive; sclerae clear; dentition good; mucous membranes moist ABDOMEN: Soft without distention. Colostomy left lower quadrant functioning normally. Midline incision with 2 dry eschars. No drainage. No sign of hernia. EXT:  non-tender without edema; no deformity NEURO: no gross focal deficits; no sign of tremor   IMPRESSION: Status post sigmoid colectomy with descending colostomy for perforated sigmoid diverticulitis, resolving  PLAN: The patient I discussed further wound care. He will apply topical antibiotic ointment to the to dry eschars until they are resolved. He will then begin applying topical cream to the incisions.  The patient will require colonoscopy prior to colostomy closure. I will asked Dr. Vida Rigger at [facility] to perform this procedure in the near future.  Patient will return to see me in 6 weeks. At that time we will begin to plan for colostomy reversal.  Velora Heckler, MD, FACS General & Endocrine Surgery [facility]

## 2012-03-11 ENCOUNTER — Encounter (INDEPENDENT_AMBULATORY_CARE_PROVIDER_SITE_OTHER): Payer: Self-pay | Admitting: Surgery

## 2012-03-11 ENCOUNTER — Encounter (INDEPENDENT_AMBULATORY_CARE_PROVIDER_SITE_OTHER): Payer: Self-pay

## 2012-03-12 ENCOUNTER — Ambulatory Visit (INDEPENDENT_AMBULATORY_CARE_PROVIDER_SITE_OTHER): Payer: Self-pay | Admitting: Surgery

## 2012-03-12 VITALS — BP 158/103 | HR 80 | Temp 97.0°F | Resp 16 | Wt 176.2 lb

## 2012-03-12 DIAGNOSIS — I10 Essential (primary) hypertension: Secondary | ICD-10-CM

## 2012-03-12 DIAGNOSIS — K5732 Diverticulitis of large intestine without perforation or abscess without bleeding: Secondary | ICD-10-CM

## 2012-03-12 DIAGNOSIS — K5720 Diverticulitis of large intestine with perforation and abscess without bleeding: Secondary | ICD-10-CM

## 2012-03-12 DIAGNOSIS — I252 Old myocardial infarction: Secondary | ICD-10-CM

## 2012-03-12 NOTE — Progress Notes (Signed)
Visit Diagnoses: 1. Diverticulitis of colon with perforation   2. Hypertension   3. MI, old     HISTORY: Patient is a 46 year old white male who underwent urgent laparotomy with resection of perforated sigmoid diverticulitis. He returns today to consider colostomy closure. At my request he has been seen by gastroenterology and has a colonoscopy scheduled for March 31, 2012. Patient has a history of coronary artery disease and hypertension. He will require a preoperative cardiology evaluation and management.  PERTINENT REVIEW OF SYSTEMS: Mild abdominal incisional pain with physical activity. Small mucoid bowel movement every other week. No bleeding per rectum.  EXAM: HEENT: normocephalic; pupils equal and reactive; sclerae clear; dentition good; mucous membranes moist NECK:  symmetric on extension; no palpable anterior or posterior cervical lymphadenopathy; no supraclavicular masses; no tenderness CHEST: clear to auscultation bilaterally without rales, rhonchi, or wheezes CARDIAC: regular rate and rhythm without significant murmur; peripheral pulses are full ABDOMEN: Abdomen is soft without distention. Midline wound is well-healed and completely re-epithelialized. Stoma in the left abdominal wall shows no sign of hernia. EXT:  non-tender without edema; no deformity NEURO: no gross focal deficits; no sign of tremor   IMPRESSION: #1 History of perforated sigmoid diverticulitis, now for colostomy closure #2 personal history of coronary artery disease, myocardial infarction #3 hypertension  PLAN: Patient will proceed with colonoscopy on March 31, 2012. We will make arrangements for him to be evaluated by cardiology preoperatively. We will also asked him to address his untreated hypertension.  Patient will go ahead today to schedule colostomy closure. I have discussed the procedure with him at length. We have discussed the risks and benefits particularly the risk of infection. We have discussed  the hospital stay would be anticipated. He understands and wishes to proceed.  The risks and benefits of the procedure have been discussed at length with the patient.  The patient understands the proposed procedure, potential alternative treatments, and the course of recovery to be expected.  All of the patient's questions have been answered at this time.  The patient wishes to proceed with surgery and will schedule a date for the procedure through our office staff.  Velora Heckler, MD, FACS General & Endocrine Surgery [facility]

## 2012-03-12 NOTE — Patient Instructions (Signed)
Proceed with colonoscopy as scheduled on April 8th.  Will arrange for cardiology assessment pre-op.  tmg

## 2012-03-13 ENCOUNTER — Encounter: Payer: Self-pay | Admitting: Cardiology

## 2012-03-13 ENCOUNTER — Ambulatory Visit (INDEPENDENT_AMBULATORY_CARE_PROVIDER_SITE_OTHER): Payer: Self-pay | Admitting: Cardiology

## 2012-03-13 VITALS — BP 160/98 | HR 66 | Ht 69.0 in | Wt 175.0 lb

## 2012-03-13 DIAGNOSIS — I2510 Atherosclerotic heart disease of native coronary artery without angina pectoris: Secondary | ICD-10-CM

## 2012-03-13 DIAGNOSIS — Z01810 Encounter for preprocedural cardiovascular examination: Secondary | ICD-10-CM | POA: Insufficient documentation

## 2012-03-13 DIAGNOSIS — I10 Essential (primary) hypertension: Secondary | ICD-10-CM

## 2012-03-13 DIAGNOSIS — E7800 Pure hypercholesterolemia, unspecified: Secondary | ICD-10-CM

## 2012-03-13 DIAGNOSIS — Z87891 Personal history of nicotine dependence: Secondary | ICD-10-CM

## 2012-03-13 MED ORDER — PRAVASTATIN SODIUM 40 MG PO TABS: 40.0000 mg | ORAL_TABLET | Freq: Every evening | ORAL | Status: AC

## 2012-03-13 MED ORDER — METOPROLOL SUCCINATE ER 50 MG PO TB24: 50.0000 mg | ORAL_TABLET | Freq: Every day | ORAL | Status: DC

## 2012-03-13 MED ORDER — PRAVASTATIN SODIUM 40 MG PO TABS: 40.0000 mg | ORAL_TABLET | Freq: Every evening | ORAL | Status: DC

## 2012-03-13 NOTE — Assessment & Plan Note (Signed)
Patient counseled on discontinuing. 

## 2012-03-13 NOTE — Assessment & Plan Note (Signed)
Patient has had no symptoms of chest pain or shortness of breath with excellent functional capacity. Previous stents within the past 5 years. He did well with recent surgery. I do not think further ischemia evaluation is indicated preoperatively.

## 2012-03-13 NOTE — Assessment & Plan Note (Signed)
Blood pressure elevated. Add Toprol 50 mg daily. Patient to monitor his blood pressure at home. We will add additional medications as needed.

## 2012-03-13 NOTE — Patient Instructions (Signed)
Your physician recommends that you schedule a follow-up appointment in: 3 MONTHS  START METOPROLOL SUCC 50 MG ONCE DAILY  START PRAVASTATIN 40 MG ONCE DAILY AT BEDTIME  Your physician recommends that you return for lab work in: 6 WEEKS=FASTING=1ST WEEK IN MAY

## 2012-03-13 NOTE — Progress Notes (Signed)
HPI: 46 year old male for preoperative evaluation prior to abdominal surgery. Patient had a myocardial infarction in 2009. Cardiac catheterization at that time revealed  a normal left main. There was a 30% LAD. There was a 30% second diagonal. No disease noted in the circumflex. There was a distal 70% RCA lesion. There was a 95% RCA lesion at the bifurcation of the PDA and posterior AV segment. The ejection fraction was 65-70%. The patient had 2 drug-eluting stents placed to his right coronary artery at that time. He has not been seen in this office since March of 2009. Patient had perforated colon in December from diverticulitis which required resection. He is scheduled for colostomy closure. We were asked to evaluate preoperatively. The patient has had no cardiac symptoms. There is no dyspnea on exertion, orthopnea, PND, pedal edema, claudication, palpitations or exertional chest pain. He is able to walk 18 holes of golf with no symptoms.      Current Outpatient Prescriptions  Medication Sig Dispense Refill  . aspirin 81 MG tablet Take 81 mg by mouth daily.        No Known Allergies  Past Medical History  Diagnosis Date  . Myocardial infarct 2008  . Nephrolithiasis   . Hypertension   . Anxiety   . Diverticulitis   . Vertigo   . CAD (coronary artery disease)     Past Surgical History  Procedure Date  . Anterior cruciate ligament repair 2006  . Coronary stent placement 2008  . Colectomy 12/18/2011    Sigmoid colectomy/Hartmann-Dr Gerkin  . Laparotomy 12/18/2011    Procedure: EXPLORATORY LAPAROTOMY;  Surgeon: Velora Heckler, MD;  Location: [facility];  Service: General;  Laterality: N/A;  . Colostomy revision 12/18/2011    Procedure: COLON RESECTION SIGMOID;  Surgeon: Velora Heckler, MD;  Location: [facility];  Service: General;  Laterality: N/A;  with end colostomy  . Wisdom tooth extraction     History   Social History  . Marital Status: Divorced    Spouse Name: N/A    Number of  Children: 2  . Years of Education: N/A   Occupational History  . Curator    Social History Main Topics  . Smoking status: Current Everyday Smoker -- 1.0 packs/day for 10 years    Types: Cigarettes  . Smokeless tobacco: Never Used  . Alcohol Use: 6.0 oz/week    10 Glasses of wine per week     occasional  . Drug Use: No  . Sexually Active: Not on file   Other Topics Concern  . Not on file   Social History Narrative  . No narrative on file    Family History  Problem Relation Age of Onset  . Hypertension Mother   . Stroke Mother   . Heart failure Father     MI at age 20  . Hypertension Father     ROS: no fevers or chills, productive cough, hemoptysis, dysphasia, odynophagia, melena, hematochezia, dysuria, hematuria, rash, seizure activity, orthopnea, PND, pedal edema, claudication. Remaining systems are negative.  Physical Exam:  Blood pressure 160/98, pulse 66, height 5\' 9"  (1.753 m), weight 175 lb (79.379 kg).  General:  Well developed/well nourished in NAD Skin warm/dry Patient not depressed No peripheral clubbing Back-normal HEENT-normal/normal eyelids Neck supple/normal carotid upstroke bilaterally; no bruits; no JVD; no thyromegaly chest - CTA/ normal expansion CV - RRR/normal S1 and S2; no murmurs, rubs or gallops;  PMI nondisplaced Abdomen -NT/ND, no HSM, no mass, + bowel sounds, no bruit, status  post previous abdominal surgery. Colostomy in place. 2+ femoral pulses, no bruits Ext-no edema, chords, 2+ DP Neuro-grossly nonfocal  ECG sinus rhythm at a rate of 66. No significant ST changes.

## 2012-03-13 NOTE — Assessment & Plan Note (Signed)
Continue aspirin. Add Pravachol 40 mg daily. Check lipids and liver in 6 weeks. 

## 2012-03-26 ENCOUNTER — Encounter (HOSPITAL_COMMUNITY): Payer: Self-pay | Admitting: Pharmacy Technician

## 2012-04-04 ENCOUNTER — Ambulatory Visit (HOSPITAL_COMMUNITY)
Admission: RE | Admit: 2012-04-04 | Discharge: 2012-04-04 | Disposition: A | Discharge status: Home / Self Care | Payer: Self-pay | Source: Ambulatory Visit | Attending: Surgery | Admitting: Surgery

## 2012-04-04 ENCOUNTER — Encounter (HOSPITAL_COMMUNITY): Payer: Self-pay

## 2012-04-04 ENCOUNTER — Encounter (HOSPITAL_COMMUNITY)
Admission: RE | Admit: 2012-04-04 | Discharge: 2012-04-04 | Disposition: A | Discharge status: Home / Self Care | Payer: Self-pay | Source: Ambulatory Visit | Attending: Surgery | Admitting: Surgery

## 2012-04-04 ENCOUNTER — Telehealth (INDEPENDENT_AMBULATORY_CARE_PROVIDER_SITE_OTHER): Payer: Self-pay | Admitting: General Surgery

## 2012-04-04 ENCOUNTER — Telehealth: Payer: Self-pay | Admitting: Cardiology

## 2012-04-04 DIAGNOSIS — I10 Essential (primary) hypertension: Secondary | ICD-10-CM | POA: Insufficient documentation

## 2012-04-04 DIAGNOSIS — K5730 Diverticulosis of large intestine without perforation or abscess without bleeding: Secondary | ICD-10-CM | POA: Insufficient documentation

## 2012-04-04 DIAGNOSIS — I2510 Atherosclerotic heart disease of native coronary artery without angina pectoris: Secondary | ICD-10-CM | POA: Insufficient documentation

## 2012-04-04 DIAGNOSIS — Z01812 Encounter for preprocedural laboratory examination: Secondary | ICD-10-CM | POA: Insufficient documentation

## 2012-04-04 DIAGNOSIS — Z01818 Encounter for other preprocedural examination: Secondary | ICD-10-CM | POA: Insufficient documentation

## 2012-04-04 LAB — BASIC METABOLIC PANEL
CO2: 26 mEq/L (ref 19–32)
Calcium: 9.1 mg/dL (ref 8.4–10.5)
Creatinine, Ser: 0.87 mg/dL (ref 0.50–1.35)
GFR calc Af Amer: >90 mL/min (ref >90)
GFR calc non Af Amer: >90 mL/min (ref >90)

## 2012-04-04 LAB — CBC
MCH: 32.5 pg (ref 26.0–34.0)
MCHC: 34.9 g/dL (ref 30.0–36.0)
MCV: 93 fL (ref 78.0–100.0)
Platelets: 157 10*3/uL (ref 150–400)
RDW: 13.8 % (ref 11.5–15.5)

## 2012-04-04 MED ORDER — METOPROLOL SUCCINATE ER 100 MG PO TB24: 100.0000 mg | ORAL_TABLET | Freq: Every day | ORAL | Status: AC

## 2012-04-04 NOTE — Telephone Encounter (Signed)
Dr. Graciela Husbands recommends for pt to increased Toprol XL to 100 mg once a day. Patient is aware to take the two 50 mg tablets until he runs out, then start the 100 mg one tablet once a day. A prescription send to his pharmacy.

## 2012-04-04 NOTE — Telephone Encounter (Signed)
Pt calling to inquire if he needs bowel prep for surgery.  Per Dr. Gerrit Friends, he needs the 2-day colon prep.  Pt has misplaced it, as he only remembers the prep for the colonscopy.  New orders signed and at the front desk for him to pick up this afternoon.

## 2012-04-04 NOTE — Progress Notes (Signed)
Quick Note:  These results are acceptable for scheduled surgery. TMG ______ 

## 2012-04-04 NOTE — Progress Notes (Signed)
Faxed to [facility] 

## 2012-04-04 NOTE — Telephone Encounter (Signed)
Fu call Patient is calling back about his BP Please call

## 2012-04-04 NOTE — Telephone Encounter (Signed)
Patient states that he continues to have a high B/P 150/ 100 and 104 diastolic after starting on Metoprolol 50 mg ( Toprol XL) once a day on 03/13/12. Patient is scheduled to have  a reversal colonoscopy next week, and he is afraid that the surgery will be cancelled due to high B/P. Patient does not have a PCP.

## 2012-04-04 NOTE — Patient Instructions (Signed)
20 Ricardo Stone  04/04/2012   Your procedure is scheduled on:  Wednesday 04/09/2012 at 0830  Report to [facility] at 0630 AM.  Call this number if you have problems the morning of surgery: [phone]   Remember:Follow bowel prep from Dr. Ardine Eng office and drink clear liquids .   Do not eat food:up until night before starting bowel prep  May have clear liquids:until Midnight .    Take these medicines the morning of surgery with A SIP OF WATER: Metoprolol   Do not wear jewelry.  Do not wear lotions, powders, or perfumes.   Do not shave 48 hours prior to surgery(women only-shaving legs)  Do not bring valuables to the hospital.  Contacts, dentures or bridgework may not be worn into surgery.  Leave suitcase in the car. After surgery it may be brought to your room.  For patients admitted to the hospital, checkout time is 11:00 AM the day of discharge.     Special Instructions: CHG Shower Use Special Wash: 1/2 bottle night before surgery and 1/2 bottle morning of surgery.   Please read over the following fact sheets that you were given: MRSA Information, Sleep apnea sheet, Incentive Spirometry sheet, blood fact sheet

## 2012-04-04 NOTE — Telephone Encounter (Signed)
Please return call to patient at hm# [phone]   Patient scheduled to have surgery 4/17 Colonoscopy Reversal. During pre-admission appnt at [facility], patient BP was 150/100.  Nurse asked that patient with Dr. Jens Som before surgery, due to Elevated BP during surgery.  Please contact patient at [phone].

## 2012-04-09 ENCOUNTER — Encounter (HOSPITAL_COMMUNITY)
Admission: RE | Disposition: A | Discharge status: Home / Self Care | Payer: Self-pay | Source: Ambulatory Visit | Attending: Surgery

## 2012-04-09 ENCOUNTER — Encounter (HOSPITAL_COMMUNITY): Payer: Self-pay | Admitting: Anesthesiology

## 2012-04-09 ENCOUNTER — Inpatient Hospital Stay (HOSPITAL_COMMUNITY)
Admission: RE | Admit: 2012-04-09 | Discharge: 2012-04-13 | DRG: 345 | Disposition: A | Discharge status: Home / Self Care | Payer: Self-pay | Source: Ambulatory Visit | Attending: Surgery | Admitting: Surgery

## 2012-04-09 ENCOUNTER — Encounter (HOSPITAL_COMMUNITY): Payer: Self-pay

## 2012-04-09 ENCOUNTER — Encounter (HOSPITAL_COMMUNITY): Payer: Self-pay | Admitting: *Deleted

## 2012-04-09 ENCOUNTER — Ambulatory Visit (HOSPITAL_COMMUNITY): Payer: Self-pay | Admitting: Anesthesiology

## 2012-04-09 DIAGNOSIS — K560 Paralytic ileus: Secondary | ICD-10-CM | POA: Diagnosis not present

## 2012-04-09 DIAGNOSIS — Z87442 Personal history of urinary calculi: Secondary | ICD-10-CM

## 2012-04-09 DIAGNOSIS — Z433 Encounter for attention to colostomy: Secondary | ICD-10-CM

## 2012-04-09 DIAGNOSIS — I252 Old myocardial infarction: Secondary | ICD-10-CM

## 2012-04-09 DIAGNOSIS — Z8719 Personal history of other diseases of the digestive system: Secondary | ICD-10-CM

## 2012-04-09 DIAGNOSIS — Z9049 Acquired absence of other specified parts of digestive tract: Secondary | ICD-10-CM

## 2012-04-09 DIAGNOSIS — K5720 Diverticulitis of large intestine with perforation and abscess without bleeding: Secondary | ICD-10-CM | POA: Diagnosis not present

## 2012-04-09 DIAGNOSIS — F17200 Nicotine dependence, unspecified, uncomplicated: Secondary | ICD-10-CM | POA: Diagnosis present

## 2012-04-09 DIAGNOSIS — I2510 Atherosclerotic heart disease of native coronary artery without angina pectoris: Secondary | ICD-10-CM | POA: Diagnosis present

## 2012-04-09 DIAGNOSIS — I10 Essential (primary) hypertension: Secondary | ICD-10-CM | POA: Diagnosis present

## 2012-04-09 HISTORY — PX: COLOSTOMY CLOSURE: SHX1381

## 2012-04-09 LAB — CBC
Hemoglobin: 15.3 g/dL (ref 13.0–17.0)
MCHC: 34.3 g/dL (ref 30.0–36.0)
RDW: 13.5 % (ref 11.5–15.5)

## 2012-04-09 LAB — TYPE AND SCREEN: ABO/RH(D): O POS

## 2012-04-09 LAB — CREATININE, SERUM
Creatinine, Ser: 0.98 mg/dL (ref 0.50–1.35)
GFR calc non Af Amer: >90 mL/min (ref >90)

## 2012-04-09 SURGERY — COLOSTOMY CLOSURE
Anesthesia: General | Site: Abdomen | Wound class: Clean Contaminated

## 2012-04-09 MED ORDER — FENTANYL CITRATE 0.05 MG/ML IJ SOLN
INTRAMUSCULAR | Status: DC | PRN
  Administered 2012-04-09: 100 ug via INTRAVENOUS
  Administered 2012-04-09 (×2): 50 ug via INTRAVENOUS
  Administered 2012-04-09: 100 ug via INTRAVENOUS
  Administered 2012-04-09: 50 ug via INTRAVENOUS

## 2012-04-09 MED ORDER — ERTAPENEM SODIUM 1 G IJ SOLR
INTRAMUSCULAR | Status: AC
  Filled 2012-04-09: qty 1

## 2012-04-09 MED ORDER — HYDROMORPHONE HCL PF 1 MG/ML IJ SOLN
INTRAMUSCULAR | Status: AC
  Filled 2012-04-09: qty 1

## 2012-04-09 MED ORDER — HYDROCODONE-ACETAMINOPHEN 5-325 MG PO TABS
1.0000 | ORAL_TABLET | ORAL | Status: DC | PRN
  Administered 2012-04-09 – 2012-04-11 (×5): 2 via ORAL
  Administered 2012-04-11: 1 via ORAL
  Administered 2012-04-11 – 2012-04-13 (×9): 2 via ORAL
  Filled 2012-04-09 (×15): qty 2

## 2012-04-09 MED ORDER — METOPROLOL SUCCINATE ER 100 MG PO TB24
100.0000 mg | ORAL_TABLET | Freq: Every day | ORAL | Status: DC
  Administered 2012-04-10 – 2012-04-13 (×4): 100 mg via ORAL
  Filled 2012-04-09 (×5): qty 1

## 2012-04-09 MED ORDER — ONDANSETRON HCL 4 MG/2ML IJ SOLN
INTRAMUSCULAR | Status: DC | PRN
  Administered 2012-04-09: 4 mg via INTRAVENOUS

## 2012-04-09 MED ORDER — METOPROLOL TARTRATE 1 MG/ML IV SOLN
1.0000 mg | INTRAVENOUS | Status: DC | PRN
  Administered 2012-04-09 (×3): 1 mg via INTRAVENOUS

## 2012-04-09 MED ORDER — KCL IN DEXTROSE-NACL 20-5-0.45 MEQ/L-%-% IV SOLN
INTRAVENOUS | Status: DC
  Administered 2012-04-09: 100 mL/h via INTRAVENOUS
  Administered 2012-04-10 – 2012-04-11 (×3): via INTRAVENOUS
  Administered 2012-04-12: 1000 mL via INTRAVENOUS
  Filled 2012-04-09 (×7): qty 1000

## 2012-04-09 MED ORDER — MIDAZOLAM HCL 5 MG/5ML IJ SOLN
INTRAMUSCULAR | Status: DC | PRN
  Administered 2012-04-09: 2 mg via INTRAVENOUS

## 2012-04-09 MED ORDER — ENOXAPARIN SODIUM 40 MG/0.4ML ~~LOC~~ SOLN
40.0000 mg | Freq: Every day | SUBCUTANEOUS | Status: DC
  Administered 2012-04-10 – 2012-04-13 (×4): 40 mg via SUBCUTANEOUS
  Filled 2012-04-09 (×4): qty 0.4

## 2012-04-09 MED ORDER — ONDANSETRON HCL 4 MG/2ML IJ SOLN: 4.0000 mg | Freq: Four times a day (QID) | INTRAMUSCULAR | Status: DC | PRN

## 2012-04-09 MED ORDER — ACETAMINOPHEN 10 MG/ML IV SOLN
INTRAVENOUS | Status: DC | PRN
  Administered 2012-04-09: 1000 mg via INTRAVENOUS

## 2012-04-09 MED ORDER — SODIUM CHLORIDE 0.9 % IV SOLN
1.0000 g | INTRAVENOUS | Status: AC
  Administered 2012-04-09: 1 g via INTRAVENOUS

## 2012-04-09 MED ORDER — METOPROLOL TARTRATE 1 MG/ML IV SOLN
INTRAVENOUS | Status: AC
  Administered 2012-04-09 (×2): 1 mg via INTRAVENOUS
  Filled 2012-04-09: qty 5

## 2012-04-09 MED ORDER — HYDRALAZINE HCL 20 MG/ML IJ SOLN
INTRAMUSCULAR | Status: DC | PRN
  Administered 2012-04-09: 2 mg via INTRAVENOUS
  Administered 2012-04-09: 4 mg via INTRAVENOUS
  Administered 2012-04-09: 2 mg via INTRAVENOUS

## 2012-04-09 MED ORDER — HYDROMORPHONE HCL PF 1 MG/ML IJ SOLN
0.2500 mg | INTRAMUSCULAR | Status: DC | PRN
  Administered 2012-04-09 (×4): 0.5 mg via INTRAVENOUS

## 2012-04-09 MED ORDER — HYDROMORPHONE HCL PF 1 MG/ML IJ SOLN
INTRAMUSCULAR | Status: DC | PRN
  Administered 2012-04-09 (×2): 1 mg via INTRAVENOUS

## 2012-04-09 MED ORDER — ACETAMINOPHEN 325 MG PO TABS: 650.0000 mg | ORAL_TABLET | ORAL | Status: DC | PRN

## 2012-04-09 MED ORDER — METOPROLOL TARTRATE 1 MG/ML IV SOLN
5.0000 mg | Freq: Four times a day (QID) | INTRAVENOUS | Status: DC
  Administered 2012-04-09: 5 mg via INTRAVENOUS
  Administered 2012-04-09: 1 mg via INTRAVENOUS
  Administered 2012-04-10 – 2012-04-12 (×8): 5 mg via INTRAVENOUS
  Filled 2012-04-09 (×14): qty 5

## 2012-04-09 MED ORDER — NEOSTIGMINE METHYLSULFATE 1 MG/ML IJ SOLN
INTRAMUSCULAR | Status: DC | PRN
  Administered 2012-04-09: 4 mg via INTRAVENOUS

## 2012-04-09 MED ORDER — LACTATED RINGERS IV SOLN
INTRAVENOUS | Status: DC | PRN
  Administered 2012-04-09 (×2): via INTRAVENOUS

## 2012-04-09 MED ORDER — ALVIMOPAN 12 MG PO CAPS
12.0000 mg | ORAL_CAPSULE | Freq: Two times a day (BID) | ORAL | Status: DC
  Administered 2012-04-10 – 2012-04-11 (×4): 12 mg via ORAL
  Filled 2012-04-09 (×8): qty 1

## 2012-04-09 MED ORDER — HYDROMORPHONE HCL PF 1 MG/ML IJ SOLN
INTRAMUSCULAR | Status: AC
  Administered 2012-04-10: 1 mg via INTRAVENOUS
  Filled 2012-04-09: qty 1

## 2012-04-09 MED ORDER — GLYCOPYRROLATE 0.2 MG/ML IJ SOLN
INTRAMUSCULAR | Status: DC | PRN
  Administered 2012-04-09: .6 mg via INTRAVENOUS

## 2012-04-09 MED ORDER — ROCURONIUM BROMIDE 100 MG/10ML IV SOLN
INTRAVENOUS | Status: DC | PRN
  Administered 2012-04-09: 10 mg via INTRAVENOUS
  Administered 2012-04-09: 30 mg via INTRAVENOUS
  Administered 2012-04-09: 10 mg via INTRAVENOUS

## 2012-04-09 MED ORDER — PROMETHAZINE HCL 25 MG/ML IJ SOLN: 6.2500 mg | INTRAMUSCULAR | Status: DC | PRN

## 2012-04-09 MED ORDER — ONDANSETRON HCL 4 MG PO TABS
4.0000 mg | ORAL_TABLET | Freq: Four times a day (QID) | ORAL | Status: DC | PRN
  Filled 2012-04-09: qty 1

## 2012-04-09 MED ORDER — ACETAMINOPHEN 10 MG/ML IV SOLN
INTRAVENOUS | Status: AC
  Filled 2012-04-09: qty 100

## 2012-04-09 MED ORDER — ALVIMOPAN 12 MG PO CAPS
ORAL_CAPSULE | ORAL | Status: AC
  Filled 2012-04-09: qty 1

## 2012-04-09 MED ORDER — ALVIMOPAN 12 MG PO CAPS
12.0000 mg | ORAL_CAPSULE | Freq: Once | ORAL | Status: AC
  Administered 2012-04-09: 12 mg via ORAL

## 2012-04-09 MED ORDER — HYDROMORPHONE HCL PF 1 MG/ML IJ SOLN
1.0000 mg | INTRAMUSCULAR | Status: DC | PRN
  Administered 2012-04-09 – 2012-04-10 (×9): 1 mg via INTRAVENOUS
  Filled 2012-04-09 (×7): qty 1

## 2012-04-09 SURGICAL SUPPLY — 46 items
APPLICATOR COTTON TIP 6IN STRL (MISCELLANEOUS) ×4 IMPLANT
BLADE EXTENDED COATED 6.5IN (ELECTRODE) ×1 IMPLANT
BLADE HEX COATED 2.75 (ELECTRODE) ×2 IMPLANT
BLADE SURG SZ10 CARB STEEL (BLADE) ×1 IMPLANT
CANISTER SUCTION 2500CC (MISCELLANEOUS) ×2 IMPLANT
CLIP TI LARGE 6 (CLIP) IMPLANT
CLOTH BEACON ORANGE TIMEOUT ST (SAFETY) ×2 IMPLANT
COVER MAYO STAND STRL (DRAPES) ×2 IMPLANT
DRAPE LAPAROSCOPIC ABDOMINAL (DRAPES) ×2 IMPLANT
DRAPE LG THREE QUARTER DISP (DRAPES) ×1 IMPLANT
DRAPE WARM FLUID 44X44 (DRAPE) ×2 IMPLANT
ELECT REM PT RETURN 9FT ADLT (ELECTROSURGICAL) ×2
ELECTRODE REM PT RTRN 9FT ADLT (ELECTROSURGICAL) ×1 IMPLANT
EVACUATOR DRAINAGE 10X20 100CC (DRAIN) IMPLANT
EVACUATOR SILICONE 100CC (DRAIN)
GLOVE BIOGEL PI IND STRL 7.0 (GLOVE) ×1 IMPLANT
GLOVE BIOGEL PI INDICATOR 7.0 (GLOVE) ×2
GLOVE SURG ORTHO 8.0 STRL STRW (GLOVE) ×3 IMPLANT
GOWN STRL NON-REIN LRG LVL3 (GOWN DISPOSABLE) ×3 IMPLANT
GOWN STRL REIN XL XLG (GOWN DISPOSABLE) ×4 IMPLANT
HAND ACTIVATED (MISCELLANEOUS) IMPLANT
KIT BASIN OR (CUSTOM PROCEDURE TRAY) ×2 IMPLANT
LEGGING LITHOTOMY PAIR STRL (DRAPES) IMPLANT
NS IRRIG 1000ML POUR BTL (IV SOLUTION) ×5 IMPLANT
PACK GENERAL/GYN (CUSTOM PROCEDURE TRAY) ×2 IMPLANT
SPONGE GAUZE 4X4 12PLY (GAUZE/BANDAGES/DRESSINGS) ×2 IMPLANT
STAPLER VISISTAT 35W (STAPLE) ×2 IMPLANT
SUCTION POOLE TIP (SUCTIONS) ×2 IMPLANT
SUT ETHILON 3 0 PS 1 (SUTURE) IMPLANT
SUT NOV 1 T60/GS (SUTURE) IMPLANT
SUT NOVA NAB DX-16 0-1 5-0 T12 (SUTURE) ×5 IMPLANT
SUT NOVA T20/GS 25 (SUTURE) IMPLANT
SUT SILK 2 0 (SUTURE) ×2
SUT SILK 2 0 SH CR/8 (SUTURE) ×2 IMPLANT
SUT SILK 2 0SH CR/8 30 (SUTURE) IMPLANT
SUT SILK 2-0 18XBRD TIE 12 (SUTURE) ×1 IMPLANT
SUT SILK 2-0 30XBRD TIE 12 (SUTURE) IMPLANT
SUT SILK 3 0 (SUTURE) ×4
SUT SILK 3 0 SH CR/8 (SUTURE) ×4 IMPLANT
SUT SILK 3-0 18XBRD TIE 12 (SUTURE) ×2 IMPLANT
SUT VIC AB 4-0 SH 18 (SUTURE) IMPLANT
TAPE CLOTH SURG 4X10 WHT LF (GAUZE/BANDAGES/DRESSINGS) ×1 IMPLANT
TOWEL OR 17X26 10 PK STRL BLUE (TOWEL DISPOSABLE) ×4 IMPLANT
TRAY FOLEY CATH 14FRSI W/METER (CATHETERS) ×2 IMPLANT
WATER STERILE IRR 1500ML POUR (IV SOLUTION) IMPLANT
YANKAUER SUCT BULB TIP NO VENT (SUCTIONS) ×2 IMPLANT

## 2012-04-09 NOTE — Preoperative (Signed)
Beta Blockers   Reason not to administer Beta Blockers:Not Applicable 

## 2012-04-09 NOTE — H&P (Signed)
Ricardo Stone is an 46 y.o. male.    Chief Complaint: diverticular disease, for colostomy closure  HPI: Patient is a 46 year old white male who underwent urgent laparotomy with resection of perforated sigmoid diverticulitis. He returns today for colostomy closure. At my request he has been seen by gastroenterology and had a colonoscopy on March 31, 2012. Patient has a history of coronary artery disease and hypertension.  Past Medical History  Diagnosis Date  . Myocardial infarct 2008  . Nephrolithiasis   . Hypertension   . Anxiety   . Diverticulitis   . Vertigo   . CAD (coronary artery disease)     Past Surgical History  Procedure Date  . Anterior cruciate ligament repair 2006  . Coronary stent placement 2008  . Colectomy 12/18/2011    Sigmoid colectomy/Hartmann-Dr Theora Vankirk  . Laparotomy 12/18/2011    Procedure: EXPLORATORY LAPAROTOMY;  Surgeon: Velora Heckler, MD;  Location: [facility]S;  Service: General;  Laterality: N/A;  . Colostomy revision 12/18/2011    Procedure: COLON RESECTION SIGMOID;  Surgeon: Velora Heckler, MD;  Location: [facility]S;  Service: General;  Laterality: N/A;  with end colostomy  . Wisdom tooth extraction     Family History  Problem Relation Age of Onset  . Hypertension Mother   . Stroke Mother   . Heart failure Father     MI at age 58  . Hypertension Father    Social History:  reports that he has been smoking Cigarettes.  He has a 5 pack-year smoking history. He has never used smokeless tobacco. He reports that he drinks about 6 ounces of alcohol per week. He reports that he does not use illicit drugs.  Allergies: No Known Allergies  Medications Prior to Admission  Medication Dose Route Frequency Provider Last Rate Last Dose  . alvimopan (ENTEREG) 12 MG capsule           . alvimopan (ENTEREG) capsule 12 mg  12 mg Oral Once Velora Heckler, MD   12 mg at 04/09/12 0716  . ertapenem (INVANZ) 1 g in sodium chloride 0.9 % 50 mL IVPB  1 g Intravenous 60 min Pre-Op  Velora Heckler, MD       Medications Prior to Admission  Medication Sig Dispense Refill  . aspirin 81 MG tablet Take 81 mg by mouth daily before breakfast.         Results for orders placed during the hospital encounter of 04/09/12 (from the past 48 hour(s))  TYPE AND SCREEN     Status: Normal   Collection Time   04/09/12  7:15 AM      Component Value Range Comment   ABO/RH(D) O POS      Antibody Screen NEG      Sample Expiration 04/12/2012     ABO/RH     Status: Normal   Collection Time   04/09/12  7:30 AM      Component Value Range Comment   ABO/RH(D) O POS      No results found.  Review of Systems  Constitutional: Negative.   HENT: Negative.   Respiratory: Negative.   Cardiovascular: Negative.   Gastrointestinal: Negative.   Genitourinary: Negative.   Musculoskeletal: Negative.   Skin: Negative.   Neurological: Negative.   Endo/Heme/Allergies: Negative.   Psychiatric/Behavioral: Negative.     Blood pressure 136/102, pulse 92, temperature 97.6 F (36.4 C), temperature source Oral, resp. rate 18, SpO2 100.00%. Physical Exam  Constitutional: He is oriented to person, place, and  time. He appears well-developed and well-nourished. No distress.  HENT:  Head: Normocephalic and atraumatic.  Right Ear: External ear normal.  Left Ear: External ear normal.  Nose: Nose normal.  Mouth/Throat: Oropharynx is clear and moist.  Eyes: Conjunctivae and EOM are normal. Pupils are equal, round, and reactive to light.  Neck: Normal range of motion. Neck supple. No tracheal deviation present. No thyromegaly present.  Cardiovascular: Normal rate, regular rhythm, normal heart sounds and intact distal pulses.   No murmur heard. Respiratory: Effort normal and breath sounds normal. No respiratory distress. He has no wheezes. He has no rales.  GI: Soft. Bowel sounds are normal. He exhibits no distension and no mass. There is no tenderness. There is no rebound and no guarding.       Stoma LLQ  without complication; Midline wound well healed  Musculoskeletal: Normal range of motion.  Lymphadenopathy:    He has no cervical adenopathy.  Neurological: He is alert and oriented to person, place, and time. He has normal reflexes.  Skin: Skin is warm and dry.  Psychiatric: He has a normal mood and affect. His behavior is normal. Judgment and thought content normal.     Assessment/Plan 1.  Diverticular disease, hx of perforation, now for colostomy closure 2.  Hx of CAD 3.  Hx of HTN 4.  Hx of nephrolithiasis  Velora Heckler, MD, [facility] Office: [phone]    Persais Ethridge M 04/09/2012, 8:25 AM

## 2012-04-09 NOTE — Progress Notes (Signed)
Pt did 2 day bowel prep as instructed

## 2012-04-09 NOTE — Anesthesia Preprocedure Evaluation (Addendum)
Anesthesia Evaluation  Patient identified by MRN, date of birth, ID band Patient awake    Reviewed: Allergy & Precautions, H&P , NPO status , Patient's Chart, lab work & pertinent test results  Airway Mallampati: II TM Distance: >3 FB Neck ROM: Full    Dental No notable dental hx.    Pulmonary Current Smoker,  breath sounds clear to auscultation  + decreased breath sounds      Cardiovascular hypertension, Pt. on medications + CAD, + Past MI and + Cardiac Stents Rhythm:Regular Rate:Normal  Severe HTN  Increased risk for periop CVA and/or MI   Neuro/Psych negative neurological ROS  negative psych ROS   GI/Hepatic negative GI ROS, Neg liver ROS,   Endo/Other  negative endocrine ROS  Renal/GU negative Renal ROS  negative genitourinary   Musculoskeletal negative musculoskeletal ROS (+)   Abdominal   Peds negative pediatric ROS (+)  Hematology negative hematology ROS (+)   Anesthesia Other Findings   Reproductive/Obstetrics negative OB ROS                         Anesthesia Physical Anesthesia Plan  ASA: III  Anesthesia Plan: General   Post-op Pain Management:    Induction: Intravenous  Airway Management Planned: Oral ETT  Additional Equipment:   Intra-op Plan:   Post-operative Plan: Extubation in OR  Informed Consent: I have reviewed the patients History and Physical, chart, labs and discussed the procedure including the risks, benefits and alternatives for the proposed anesthesia with the patient or authorized representative who has indicated his/her understanding and acceptance.   Dental advisory given  Plan Discussed with: CRNA  Anesthesia Plan Comments:         Anesthesia Quick Evaluation

## 2012-04-09 NOTE — Transfer of Care (Signed)
Immediate Anesthesia Transfer of Care Note  Patient: Ricardo Stone  Procedure(s) Performed: Procedure(s) (LRB): COLOSTOMY CLOSURE (N/A)  Patient Location: PACU  Anesthesia Type: General  Level of Consciousness: awake, alert , oriented and patient cooperative  Airway & Oxygen Therapy: Patient Spontanous Breathing and Patient connected to face mask oxygen  Post-op Assessment: Report given to PACU RN, Post -op Vital signs reviewed and stable and Patient moving all extremities X 4  Post vital signs: Reviewed and stable  Complications: No apparent anesthesia complications

## 2012-04-09 NOTE — Brief Op Note (Signed)
04/09/2012  10:52 AM  PATIENT:  Ricardo Stone  46 y.o. male  PRE-OPERATIVE DIAGNOSIS:  diverticular disease, colostomy takedown  POST-OPERATIVE DIAGNOSIS:  diverticular disease, colostomy takedown  PROCEDURE:  Procedure(s) (LRB): COLOSTOMY CLOSURE (N/A)  SURGEON:  Surgeon(s) and Role:    * Velora Heckler, MD - Primary    * Currie Paris, MD  ANESTHESIA:   general  EBL:  Total I/O In: 1000 [I.V.:1000] Out: 175 [Urine:150; Blood:25]  BLOOD ADMINISTERED:none  DRAINS: none   LOCAL MEDICATIONS USED:  NONE  SPECIMEN:  Excision  DISPOSITION OF SPECIMEN:  PATHOLOGY  COUNTS:  YES  TOURNIQUET:  * No tourniquets in log *  DICTATION: .Other Dictation: Dictation Number [id number]  PLAN OF CARE: Admit to inpatient   PATIENT DISPOSITION:  PACU - hemodynamically stable.   Delay start of Pharmacological VTE agent (>24hrs) due to surgical blood loss or risk of bleeding: yes  Velora Heckler, MD, [facility] Office: [phone]

## 2012-04-09 NOTE — Anesthesia Postprocedure Evaluation (Signed)
  Anesthesia Post-op Note  Patient: Ricardo Stone  Procedure(s) Performed: Procedure(s) (LRB): COLOSTOMY CLOSURE (N/A)  Patient Location: PACU  Anesthesia Type: General  Level of Consciousness: awake and alert   Airway and Oxygen Therapy: Patient Spontanous Breathing  Post-op Pain: mild  Post-op Assessment: Post-op Vital signs reviewed, Patient's Cardiovascular Status Stable, Respiratory Function Stable, Patent Airway and No signs of Nausea or vomiting  Post-op Vital Signs: stable  Complications: No apparent anesthesia complications

## 2012-04-10 NOTE — Op Note (Signed)
NAMEMarland Kitchen  DOC, MANDALA NO.:  [id number]  MEDICAL RECORD NO.:  [id number]  LOCATION:  1509                         FACILITY:  [facility]  PHYSICIAN:  Velora Heckler, MD      DATE OF BIRTH:  30-Sep-1966  DATE OF PROCEDURE: 09 April 2012                              OPERATIVE REPORT   PREOPERATIVE DIAGNOSIS:  Diverticular disease, colostomy closure.  POSTOPERATIVE DIAGNOSIS:  Diverticular disease, colostomy closure.  PROCEDURE:  Colostomy closure with resection.  SURGEON:  Velora Heckler, MD, FACS  ASSISTANT:  Currie Paris, MD, FACS  ANESTHESIA:  General per Dr. Eilene Ghazi.  ESTIMATED BLOOD LOSS:  Minimal.  PREPARATION:  ChloraPrep and Betadine.  COMPLICATIONS:  None.  INDICATIONS:  The patient is a 46 year old white male, who presented with perforated sigmoid diverticulitis on Christmas 2012.  He underwent resection with end colostomy and Hartmann procedure.  The patient has undergone evaluation with colonoscopy in early April 2013.  He now returns to the operating room for colostomy closure.  BODY OF REPORT:  Procedure was done in OR #6 at the [facility].  The patient was brought to the operating room, placed in the supine position on the operating room table.  Following administration of general anesthesia, the patient was positioned and then prepped and draped in the usual strict aseptic fashion.  After ascertaining that an adequate level of anesthesia had been achieved, the patient's previous midline surgical incision was incised.  The previous scar was excised.  Hemostasis was obtained with electrocautery. Permanent suture material in the midline incision was extracted. Midline was incised with the electrocautery and the peritoneal cavity was entered cautiously.  Adhesions of the omentum to the anterior abdominal wall were taken down with the electrocautery.  There were relatively few adhesions around the site of colostomy in  the left lower quadrant.  Mesentery and bowel were freed from the peritoneal surface circumferentially.  An elliptical incision was then made on the skin so as to encompass the entire colostomy.  This was performed with a #10 blade.  Dissection was carried into the subcutaneous tissues and hemostasis obtained with electrocautery.  Colostomy was completely freed from the skin and subcutaneous tissues, and delivered back within the peritoneal cavity. The colostomy was resected.  A point approximately 5 cm proximal to the ostomy was selected.  Mesentery was divided between [facility] clamps, ligated with 2-0 silk ties.  2-0 silk stay sutures were placed and the bowel was transected with the electrocautery.  Bowel prep appears to be good.  Next, the distal sigmoid was identified by Prolene marking sutures.  The distal sigmoid and proximal rectum were mobilized by incising the peritoneum bilaterally.  The previous staple line was excised and 2-0 silk stay sutures were placed.  Next, an end-to-end anastomosis was performed between the distal descending colon and the distal sigmoid colon.  This was performed in a single layer with interrupted 2-0 and 3-0 silk sutures.  There was no tension on the anastomosis.  There does not appear to be any evidence of ischemia.  Abdomen was irrigated and good hemostasis was noted throughout.  Packs and retractors were removed.  Colostomy fascial defect  was closed with interrupted #1 Novafil simple sutures.  The midline fascial incision was closed with interrupted #1 Novafil simple sutures.  Subcutaneous tissues were irrigated.  Skin of both incisions were closed with stainless steel staples.  Sterile dressings were applied.  The patient was awakened from anesthesia and brought to the recovery room.  The patient tolerated the procedure well.   Velora Heckler, MD, FACS   TMG/MEDQ  D:  04/09/2012  T:  04/10/2012  Job:  [id number]

## 2012-04-10 NOTE — Progress Notes (Signed)
Patient ID: Ricardo Stone, male   DOB: 06/03/1966, 46 y.o.   MRN: [id number]  General Surgery - [facility] - Progress Note  POD# 1  Subjective: Patient up in chair.  Ambulated.  On telemetry - ? Reason.  No nausea.  Thirsty.  Objective: Vital signs in last 24 hours: Temp:  [97.5 F (36.4 C)-98.2 F (36.8 C)] 98.2 F (36.8 C) (04/18 0542) Pulse Rate:  [59-90] 73  (04/18 0542) Resp:  [14-23] 18  (04/18 0542) BP: (123-209)/(72-127) 148/86 mmHg (04/18 0542) SpO2:  [92 %-100 %] 93 % (04/18 0542) Weight:  [172 lb 6.4 oz (78.2 kg)] 172 lb 6.4 oz (78.2 kg) (04/17 1822) Last BM Date: 04/08/12  Intake/Output from previous day: 04/17 0701 - 04/18 0700 In: 2635 [I.V.:2635] Out: 1425 [Urine:1400; Blood:25]  Exam: HEENT - clear, not icteric Neck - soft Chest - clear bilaterally Cor - RRR, no murmur Abd - mild distension; dressings dry and intact; few BS Ext - no significant edema Neuro - grossly intact, no focal deficits  Lab Results:   Basename 04/09/12 1817  WBC 17.0*  HGB 15.3  HCT 44.6  PLT 166     Basename 04/09/12 1817  NA --  K --  CL --  CO2 --  GLUCOSE --  BUN --  CREATININE 0.98  CALCIUM --    Studies/Results: No results found.  Assessment / Plan: 1.  Colostomy closure after perforated diverticulitis  - allow CL diet today  - OOB, ambulate in halls 2.  Hypertension  - does not need telemetry  - will ask cardiology to evaluate antihypertensive regimen  Velora Heckler, MD, [facility] Office: [phone]  04/10/2012

## 2012-04-10 NOTE — Progress Notes (Signed)
UR completed 

## 2012-04-11 NOTE — Progress Notes (Signed)
Patient ID: Ricardo Stone, male   DOB: 10-05-1966, 46 y.o.   MRN: [id number]  General Surgery - [facility] - Progress Note  POD# 2  Subjective: Patient up to chair.  Ambulatory.  Tolerating CL diet.  No nausea.  Mild pain - on po meds.  Objective: Vital signs in last 24 hours: Temp:  [98.3 F (36.8 C)-98.7 F (37.1 C)] 98.3 F (36.8 C) (04/19 0533) Pulse Rate:  [61-66] 61  (04/19 0533) Resp:  [18-19] 19  (04/19 0533) BP: (138-168)/(91-98) 138/91 mmHg (04/19 0533) SpO2:  [95 %-96 %] 96 % (04/19 0533) Last BM Date: 04/08/12  Intake/Output from previous day: 04/18 0701 - 04/19 0700 In: 1080 [P.O.:1080] Out: 400 [Urine:400]  Exam: HEENT - clear, not icteric Neck - soft Chest - clear bilaterally Cor - RRR, no murmur Abd - mild distension; BS present; dressing dry and intact Ext - no significant edema Neuro - grossly intact, no focal deficits  Lab Results:   Basename 04/09/12 1817  WBC 17.0*  HGB 15.3  HCT 44.6  PLT 166     Basename 04/09/12 1817  NA --  K --  CL --  CO2 --  GLUCOSE --  BUN --  CREATININE 0.98  CALCIUM --    Studies/Results: No results found.  Assessment / Plan: 1.  Diverticular disease - colostomy closure  - ileus resolving on Entereg  - advance to regular diet as tolerated  - ambulate  Velora Heckler, MD, [facility] Office: [phone]  04/11/2012

## 2012-04-12 NOTE — Progress Notes (Signed)
Patient ID: Ricardo Stone, male   DOB: 13-Apr-1966, 46 y.o.   MRN: [id number]  General Surgery - [facility] - Progress Note  POD# 3  Subjective: Patient ambulatory.  No complaints.  BM this AM x 2 per patient.  Passing flatus.  Objective: Vital signs in last 24 hours: Temp:  [97.7 F (36.5 C)-98.6 F (37 C)] 97.7 F (36.5 C) (04/20 0600) Pulse Rate:  [62-71] 62  (04/20 0600) Resp:  [18-19] 18  (04/20 0600) BP: (142-154)/(75-109) 142/83 mmHg (04/20 0600) SpO2:  [96 %-98 %] 96 % (04/20 0600) Last BM Date: 04/12/12  Intake/Output from previous day: 04/19 0701 - 04/20 0700 In: 2989.2 [P.O.:1200; I.V.:1789.2] Out: 850 [Urine:850]  Exam: HEENT - clear, not icteric Neck - soft Chest - clear bilaterally Cor - RRR, no murmur Abd - moderate distension; relatively few BS present; wounds clear and dry with slight ecchymosis Ext - no significant edema Neuro - grossly intact, no focal deficits  Lab Results:   Basename 04/09/12 1817  WBC 17.0*  HGB 15.3  HCT 44.6  PLT 166     Basename 04/09/12 1817  NA --  K --  CL --  CO2 --  GLUCOSE --  BUN --  CREATININE 0.98  CALCIUM --    Studies/Results: No results found.  Assessment / Plan: 1.  Status post colostomy closure  - encourage ambulation  - increase po intake  - monitor wounds  - likely home in AM 4/21  - stop Entereg per protocol  Velora Heckler, MD, [facility] Office: [phone]  04/12/2012

## 2012-04-12 NOTE — Progress Notes (Signed)
Pharmacy Brief Note - Alvimopan (Entereg)  The standing order set for alvimopan (Entereg) now includes an automatic order to discontinue the drug after the patient has had a bowel movement.  The change was approved by the Pharmacy & Therapeutics Committee and the Medical Executive Committee.    This patient has had a bowel movement 4/20 documented by nursing.  Therefore, alvimopan has been discontinued.  If there are questions, please contact the pharmacy at [phone].  Thank you-  Chilton Si, Shanteria Laye L 9:34 AM

## 2012-04-12 NOTE — Progress Notes (Signed)
Per Dr. Gerrit Friends -okay to leave IV site out.

## 2012-04-13 MED ORDER — HYDROCODONE-ACETAMINOPHEN 5-325 MG PO TABS: 1.0000 | ORAL_TABLET | ORAL | Status: AC | PRN

## 2012-04-13 NOTE — Discharge Summary (Signed)
Physician Discharge Summary [facility]  Patient ID: Ricardo Stone MRN: [id number] DOB/AGE: Aug 17, 1966 46 y.o.  Admit date: 04/09/2012 Discharge date: 04/13/2012  Admission Diagnoses:  Hx of perforated diverticulitis, for colostomy closure  Discharge Diagnoses:  Principal Problem:  *Diverticulitis of colon with perforation   Discharged Condition: good  Hospital Course: patient admitted on day of surgery for colostomy closure.  Post op course uneventful.  Received Entereg.  Diet advanced from clear liquid to regular.  Bowel function returned.  Prepared for discharge POD#4.  Pain well controlled on po narcotics.  Consults: None  Significant Diagnostic Studies: none  Treatments: surgery: colostomy closure  Discharge Exam: Blood pressure 144/93, pulse 57, temperature 97.8 F (36.6 C), temperature source Oral, resp. rate 18, height 5\' 9"  (1.753 m), weight 172 lb 6.4 oz (78.2 kg), SpO2 98.00%. HEENT - clear Neck - soft, no mass Chest - clear bilat Cor - RRR Abd - wounds clear and dry; BS present; mild erythema at stoma wound - no drainage Ext - no edema  Disposition: Home today  Discharge Orders    Future Appointments: Provider: Department: Dept Phone: Center:   04/28/2012 9:10 AM [facility] [facility] [phone] [facility]   04/28/2012 11:00 AM Velora Heckler, MD [facility] [place] [phone] None   06/09/2012 8:45 AM Lewayne Bunting, MD [facility] [phone] [facility]     Future Orders Please Complete By Expires   Diet - low sodium heart healthy      Increase activity slowly      Discharge instructions      Comments:   [facility]      [facility], [place] [phone]  OPEN ABDOMINAL SURGERY: POST OP INSTRUCTIONS  Always review your discharge instruction sheet given to you by the facility where your surgery was performed.  IF YOU HAVE DISABILITY OR FAMILY LEAVE FORMS, YOU MUST BRING THEM TO THE OFFICE FOR PROCESSING.   PLEASE DO NOT GIVE THEM TO YOUR DOCTOR.  A prescription for pain medication may be given to you upon discharge.  Take your pain medication as prescribed, if needed.  If narcotic pain medicine is not needed, then you may take acetaminophen (Tylenol) or ibuprofen (Advil) as needed. Take your usually prescribed medications unless otherwise directed. If you need a refill on your pain medication, please contact your pharmacy. They will contact our office to request authorization.  Prescriptions will not be filled after 5pm or on week-ends. You should follow a light diet the first few days after arrival home, such as soup and crackers, pudding, etc.unless your doctor has advised otherwise. A high-fiber, low fat diet can be resumed as tolerated.   Be sure to include lots of fluids daily. Most patients will experience some swelling and bruising on the chest and neck area.  Ice packs will help.  Swelling and bruising can take several days to resolve Most patients will experience some swelling and bruising in the area of the incision. Ice pack will help. Swelling and bruising can take several days to resolve..  It is common to experience some constipation if taking pain medication after surgery.  Increasing fluid intake and taking a stool softener will usually help or prevent this problem from occurring.  A mild laxative (Milk of Magnesia or Miralax) should be taken according to package directions if there are no bowel movements after 48 hours.  You may have steri-strips (small skin tapes) in place directly over the incision.  These strips should be left  on the skin for 7-10 days.  If your surgeon used skin glue on the incision, you may shower in 24 hours.  The glue will flake off over the next 2-3 weeks.  Any sutures or staples will be removed at the office during your follow-up visit. You may find that a light gauze bandage over your incision may keep your staples from being rubbed or pulled. You may shower and  replace the bandage daily. ACTIVITIES:  You may resume regular (light) daily activities beginning the next day--such as daily self-care, walking, climbing stairs--gradually increasing activities as tolerated.  You may have sexual intercourse when it is comfortable.  Refrain from any heavy lifting or straining until approved by your doctor. You may drive when you no longer are taking prescription pain medication, you can comfortably wear a seatbelt, and you can safely maneuver your car and apply brakes.   You should see your doctor in the office for a follow-up appointment approximately two weeks after your surgery.  Make sure that you call for this appointment within a day or two after you arrive home to insure a convenient appointment time. OTHER INSTRUCTIONS:  _____________________________________________________________ _____________________________________________________________  WHEN TO CALL YOUR DOCTOR: Fever over 101.0 Inability to urinate Nausea and/or vomiting Extreme swelling or bruising Continued bleeding from incision. Increased pain, redness, or drainage from the incision. Difficulty swallowing or breathing Muscle cramping or spasms. Numbness or tingling in hands or feet or around lips.  The clinic staff is available to answer your questions during regular business hours.  Please don't hesitate to call and ask to speak to one of the nurses if you have concerns.  For further questions, please visit [email or web address]    No dressing needed        Medication List  As of 04/13/2012 10:29 AM   TAKE these medications         acetaminophen 325 MG tablet   Commonly known as: TYLENOL   Take 650 mg by mouth every 6 (six) hours as needed. Pain        aspirin 81 MG tablet   Take 81 mg by mouth daily before breakfast.      HYDROcodone-acetaminophen 5-325 MG per tablet   Commonly known as: NORCO   Take 1-2 tablets by mouth every 4 (four) hours as needed for pain.       metoprolol succinate 100 MG 24 hr tablet   Commonly known as: TOPROL-XL   Take 1 tablet (100 mg total) by mouth daily. Take with or immediately following a meal.      pravastatin 40 MG tablet   Commonly known as: PRAVACHOL   Take 1 tablet (40 mg total) by mouth every evening.           Velora Heckler, MD, [facility] Office: [phone]   Signed: Velora Heckler 04/13/2012, 10:29 AM

## 2012-04-21 ENCOUNTER — Ambulatory Visit (INDEPENDENT_AMBULATORY_CARE_PROVIDER_SITE_OTHER): Payer: Self-pay | Admitting: General Surgery

## 2012-04-21 ENCOUNTER — Encounter (HOSPITAL_COMMUNITY): Payer: Self-pay | Admitting: Surgery

## 2012-04-21 DIAGNOSIS — Z4802 Encounter for removal of sutures: Secondary | ICD-10-CM

## 2012-04-21 NOTE — Progress Notes (Signed)
Patient comes in status post colostomy reversal by Dr Gerrit Friends 8 days ago. Incisions look good. Staples were removed and benzoin/steri- strips placed. When staples were removed from left sided abdominal incision fluid started draining. I had Dr Derrell Lolling check incision. He confirmed just a seroma. Incision was covered. Patient instructed to keep covered until area stopped draining. He was advised on signs of wound infection and told to make a follow up in 2-3 weeks with Dr Gerrit Friends per Dr Derrell Lolling. Patient will call with any questions prior.

## 2012-04-21 NOTE — Patient Instructions (Signed)
Schedule appt with Dr Gerrit Friends, call us if any redness or fevers prior to appt

## 2012-04-28 ENCOUNTER — Encounter (INDEPENDENT_AMBULATORY_CARE_PROVIDER_SITE_OTHER): Payer: Self-pay | Admitting: Surgery

## 2012-04-28 ENCOUNTER — Ambulatory Visit (INDEPENDENT_AMBULATORY_CARE_PROVIDER_SITE_OTHER): Payer: Self-pay | Admitting: Surgery

## 2012-04-28 ENCOUNTER — Other Ambulatory Visit (INDEPENDENT_AMBULATORY_CARE_PROVIDER_SITE_OTHER): Payer: Self-pay

## 2012-04-28 VITALS — BP 150/102 | HR 64 | Temp 97.1°F | Resp 14 | Ht 69.0 in | Wt 167.4 lb

## 2012-04-28 DIAGNOSIS — K5720 Diverticulitis of large intestine with perforation and abscess without bleeding: Secondary | ICD-10-CM

## 2012-04-28 DIAGNOSIS — E7800 Pure hypercholesterolemia, unspecified: Secondary | ICD-10-CM

## 2012-04-28 DIAGNOSIS — K5732 Diverticulitis of large intestine without perforation or abscess without bleeding: Secondary | ICD-10-CM

## 2012-04-28 DIAGNOSIS — I2510 Atherosclerotic heart disease of native coronary artery without angina pectoris: Secondary | ICD-10-CM

## 2012-04-28 LAB — LIPID PANEL
Cholesterol: 155 mg/dL (ref 0–200)
HDL: 36.8 mg/dL — ABNORMAL LOW (ref >39.00)
VLDL: 15.4 mg/dL (ref 0.0–40.0)

## 2012-04-28 LAB — HEPATIC FUNCTION PANEL
ALT: 17 U/L (ref 0–53)
AST: 14 U/L (ref 0–37)
Bilirubin, Direct: 0 mg/dL (ref 0.0–0.3)
Total Protein: 6.9 g/dL (ref 6.0–8.3)

## 2012-04-28 NOTE — Progress Notes (Signed)
Visit Diagnoses: 1. Diverticulitis of colon with perforation     HISTORY: The patient returns for postoperative visit having undergone colostomy closure on April 09, 2012. Postoperative course has been uneventful. He has had some irregularity with his bowel movements. He has had no incontinence.  EXAM: Surgical wounds are healing nicely. All staples are then removed. With Valsalva there is no sign of herniation. There is no sign of infection. There are no palpable masses. There is no significant tenderness.  IMPRESSION: Status post colostomy closure following history of perforated diverticular disease  PLAN: The patient will begin to applying topical creams his incisions. He will return to see me for a final wound check in 6 weeks.  Velora Heckler, MD, FACS General & Endocrine Surgery [facility]

## 2012-04-28 NOTE — Patient Instructions (Signed)
  COCOA BUTTER & VITAMIN E CREAM  ([employer] or other brand)  Apply cocoa butter/vitamin E cream to your incision 2 - 3 times daily.  Massage cream into incision for one minute with each application.  Use sunscreen (50 SPF or higher) for first 6 months after surgery if area is exposed to sun.  You may substitute Mederma or other scar reducing creams as desired.   

## 2012-04-30 ENCOUNTER — Telehealth: Payer: Self-pay | Admitting: Cardiology

## 2012-04-30 ENCOUNTER — Encounter (INDEPENDENT_AMBULATORY_CARE_PROVIDER_SITE_OTHER): Payer: Self-pay | Admitting: Surgery

## 2012-04-30 NOTE — Telephone Encounter (Signed)
New Problem:     Patient returned your phone call.  Please call back. 

## 2012-04-30 NOTE — Telephone Encounter (Signed)
Spoke with pt, aware of lab results. 

## 2012-05-02 ENCOUNTER — Encounter (INDEPENDENT_AMBULATORY_CARE_PROVIDER_SITE_OTHER): Payer: Self-pay

## 2012-05-07 ENCOUNTER — Encounter: Payer: Self-pay | Admitting: *Deleted

## 2012-05-21 ENCOUNTER — Encounter (INDEPENDENT_AMBULATORY_CARE_PROVIDER_SITE_OTHER): Payer: Self-pay | Admitting: Surgery

## 2012-06-09 ENCOUNTER — Ambulatory Visit: Payer: Self-pay | Admitting: Cardiology

## 2012-06-11 ENCOUNTER — Encounter (INDEPENDENT_AMBULATORY_CARE_PROVIDER_SITE_OTHER): Payer: Self-pay | Admitting: Surgery

## 2012-06-11 ENCOUNTER — Ambulatory Visit (INDEPENDENT_AMBULATORY_CARE_PROVIDER_SITE_OTHER): Payer: Self-pay | Admitting: Surgery

## 2012-06-11 VITALS — BP 132/88 | HR 70 | Temp 97.8°F | Resp 14 | Ht 69.0 in | Wt 172.2 lb

## 2012-06-11 DIAGNOSIS — K5720 Diverticulitis of large intestine with perforation and abscess without bleeding: Secondary | ICD-10-CM

## 2012-06-11 DIAGNOSIS — K5732 Diverticulitis of large intestine without perforation or abscess without bleeding: Secondary | ICD-10-CM

## 2012-06-11 NOTE — Patient Instructions (Signed)
  COCOA BUTTER & VITAMIN E CREAM  ([employer] or other brand)  Apply cocoa butter/vitamin E cream to your incision 2 - 3 times daily.  Massage cream into incision for one minute with each application.  Use sunscreen (50 SPF or higher) for first 6 months after surgery if area is exposed to sun.  You may substitute Mederma or other scar reducing creams as desired.   

## 2012-06-11 NOTE — Progress Notes (Signed)
General Surgery [facility]  Visit Diagnoses: 1. Diverticulitis of colon with perforation     HISTORY: Patient returns for postoperative visit. It has now been 2 months since colostomy closure. He reports his bowel habits have returned to near normal. He is anxious to begin full physical activity.  EXAM: Abdomen is soft nontender without distention. Wounds are well healed. No sign of herniation. No sign of infection. No palpable masses. No tenderness.  IMPRESSION: Status post sigmoid colectomy for perforated diverticulitis, status post colostomy closure  PLAN: Patient is released of activity at this time. He will return to see me as needed.  Velora Heckler, MD, FACS General & Endocrine Surgery [facility]

## 2012-06-25 ENCOUNTER — Encounter (INDEPENDENT_AMBULATORY_CARE_PROVIDER_SITE_OTHER): Payer: Self-pay | Admitting: Surgery

## 2015-09-07 ENCOUNTER — Encounter (HOSPITAL_COMMUNITY): Payer: Self-pay | Admitting: Emergency Medicine

## 2015-09-07 ENCOUNTER — Emergency Department (HOSPITAL_COMMUNITY): Payer: Self-pay

## 2015-09-07 ENCOUNTER — Emergency Department (HOSPITAL_COMMUNITY)
Admission: EM | Admit: 2015-09-07 | Discharge: 2015-09-07 | Disposition: A | Discharge status: Home / Self Care | Payer: Self-pay | Attending: Emergency Medicine | Admitting: Emergency Medicine

## 2015-09-07 DIAGNOSIS — I10 Essential (primary) hypertension: Secondary | ICD-10-CM | POA: Insufficient documentation

## 2015-09-07 DIAGNOSIS — I252 Old myocardial infarction: Secondary | ICD-10-CM | POA: Insufficient documentation

## 2015-09-07 DIAGNOSIS — Z8659 Personal history of other mental and behavioral disorders: Secondary | ICD-10-CM | POA: Insufficient documentation

## 2015-09-07 DIAGNOSIS — I2510 Atherosclerotic heart disease of native coronary artery without angina pectoris: Secondary | ICD-10-CM | POA: Insufficient documentation

## 2015-09-07 DIAGNOSIS — R202 Paresthesia of skin: Secondary | ICD-10-CM | POA: Insufficient documentation

## 2015-09-07 DIAGNOSIS — Z9861 Coronary angioplasty status: Secondary | ICD-10-CM | POA: Insufficient documentation

## 2015-09-07 DIAGNOSIS — Z720 Tobacco use: Secondary | ICD-10-CM | POA: Insufficient documentation

## 2015-09-07 DIAGNOSIS — Z7982 Long term (current) use of aspirin: Secondary | ICD-10-CM | POA: Insufficient documentation

## 2015-09-07 DIAGNOSIS — Z87448 Personal history of other diseases of urinary system: Secondary | ICD-10-CM | POA: Insufficient documentation

## 2015-09-07 LAB — CBC WITH DIFFERENTIAL/PLATELET
BASOS ABS: 0.1 10*3/uL (ref 0.0–0.1)
BASOS PCT: 1 %
EOS PCT: 4 %
Eosinophils Absolute: 0.5 10*3/uL (ref 0.0–0.7)
HCT: 48.1 % (ref 39.0–52.0)
Hemoglobin: 16.3 g/dL (ref 13.0–17.0)
LYMPHS PCT: 41 %
Lymphs Abs: 5.1 10*3/uL — ABNORMAL HIGH (ref 0.7–4.0)
MCH: 31.4 pg (ref 26.0–34.0)
MCHC: 33.9 g/dL (ref 30.0–36.0)
MCV: 92.7 fL (ref 78.0–100.0)
Monocytes Absolute: 1.1 10*3/uL — ABNORMAL HIGH (ref 0.1–1.0)
Monocytes Relative: 9 %
NEUTROS ABS: 5.7 10*3/uL (ref 1.7–7.7)
Neutrophils Relative %: 46 %
PLATELETS: 165 10*3/uL (ref 150–400)
RBC: 5.19 MIL/uL (ref 4.22–5.81)
RDW: 14.4 % (ref 11.5–15.5)
WBC: 12.4 10*3/uL — AB (ref 4.0–10.5)

## 2015-09-07 LAB — BASIC METABOLIC PANEL
ANION GAP: 8 (ref 5–15)
BUN: 15 mg/dL (ref 6–20)
CALCIUM: 8.8 mg/dL — AB (ref 8.9–10.3)
CO2: 24 mmol/L (ref 22–32)
Chloride: 107 mmol/L (ref 101–111)
Creatinine, Ser: 0.94 mg/dL (ref 0.61–1.24)
GFR calc Af Amer: >60 mL/min (ref >60)
Glucose, Bld: 94 mg/dL (ref 65–99)
POTASSIUM: 3.7 mmol/L (ref 3.5–5.1)
SODIUM: 139 mmol/L (ref 135–145)

## 2015-09-07 MED ORDER — LISINOPRIL 20 MG PO TABS: 20.0000 mg | ORAL_TABLET | Freq: Every day | ORAL | Status: DC

## 2015-09-07 NOTE — Discharge Instructions (Signed)
Paresthesia Paresthesia is an abnormal burning or prickling sensation. This sensation is generally felt in the hands, arms, legs, or feet. However, it may occur in any part of the body. It is usually not painful. The feeling may be described as:  Tingling or numbness.  "Pins and needles."  Skin crawling.  Buzzing.  Limbs "falling asleep."  Itching. Most people experience temporary (transient) paresthesia at some time in their lives. CAUSES  Paresthesia may occur when you breathe too quickly (hyperventilation). It can also occur without any apparent cause. Commonly, paresthesia occurs when pressure is placed on a nerve. The feeling quickly goes away once the pressure is removed. For some people, however, paresthesia is a long-lasting (chronic) condition caused by an underlying disorder. The underlying disorder may be:  A traumatic, direct injury to nerves. Examples include a:  Broken (fractured) neck.  Fractured skull.  A disorder affecting the brain and spinal cord (central nervous system). Examples include:  Transverse myelitis.  Encephalitis.  Transient ischemic attack.  Multiple sclerosis.  Stroke.  Tumor or blood vessel problems, such as an arteriovenous malformation pressing against the brain or spinal cord.  A condition that damages the peripheral nerves (peripheral neuropathy). Peripheral nerves are not part of the brain and spinal cord. These conditions include:  Diabetes.  Peripheral vascular disease.  Nerve entrapment syndromes, such as carpal tunnel syndrome.  Shingles.  Hypothyroidism.  Vitamin B12 deficiencies.  Alcoholism.  Heavy metal poisoning (lead, arsenic).  Rheumatoid arthritis.  Systemic lupus erythematosus. DIAGNOSIS  Your caregiver will attempt to find the underlying cause of your paresthesia. Your caregiver may:  Take your medical history.  Perform a physical exam.  Order various lab tests.  Order imaging tests. TREATMENT    Treatment for paresthesia depends on the underlying cause. HOME CARE INSTRUCTIONS  Avoid drinking alcohol.  You may consider massage or acupuncture to help relieve your symptoms.  Keep all follow-up appointments as directed by your caregiver. SEEK IMMEDIATE MEDICAL CARE IF:   You feel weak.  You have trouble walking or moving.  You have problems with speech or vision.  You feel confused.  You cannot control your bladder or bowel movements.  You feel numbness after an injury.  You faint.  Your burning or prickling feeling gets worse when walking.  You have pain, cramps, or dizziness.  You develop a rash. MAKE SURE YOU:  Understand these instructions.  Will watch your condition.  Will get help right away if you are not doing well or get worse. Document Released: 11/30/2002 Document Revised: 03/03/2012 Document Reviewed: 08/31/2011 [facility] Patient Information 2015 [place], [place]. This information is not intended to replace advice given to you by your health care provider. Make sure you discuss any questions you have with your health care provider. Hypertension Hypertension, commonly called high blood pressure, is when the force of blood pumping through your arteries is too strong. Your arteries are the blood vessels that carry blood from your heart throughout your body. A blood pressure reading consists of a higher number over a lower number, such as 110/72. The higher number (systolic) is the pressure inside your arteries when your heart pumps. The lower number (diastolic) is the pressure inside your arteries when your heart relaxes. Ideally you want your blood pressure below 120/80. Hypertension forces your heart to work harder to pump blood. Your arteries may become narrow or stiff. Having hypertension puts you at risk for heart disease, stroke, and other problems.  RISK FACTORS Some risk factors for high  blood pressure are controllable. Others are not.  Risk factors  you cannot control include:   Race. You may be at higher risk if you are African American.  Age. Risk increases with age.  Gender. Men are at higher risk than women before age 79 years. After age 80, women are at higher risk than men. Risk factors you can control include:  Not getting enough exercise or physical activity.  Being overweight.  Getting too much fat, sugar, calories, or salt in your diet.  Drinking too much alcohol. SIGNS AND SYMPTOMS Hypertension does not usually cause signs or symptoms. Extremely high blood pressure (hypertensive crisis) may cause headache, anxiety, shortness of breath, and nosebleed. DIAGNOSIS  To check if you have hypertension, your health care provider will measure your blood pressure while you are seated, with your arm held at the level of your heart. It should be measured at least twice using the same arm. Certain conditions can cause a difference in blood pressure between your right and left arms. A blood pressure reading that is higher than normal on one occasion does not mean that you need treatment. If one blood pressure reading is high, ask your health care provider about having it checked again. TREATMENT  Treating high blood pressure includes making lifestyle changes and possibly taking medicine. Living a healthy lifestyle can help lower high blood pressure. You may need to change some of your habits. Lifestyle changes may include:  Following the DASH diet. This diet is high in fruits, vegetables, and whole grains. It is low in salt, red meat, and added sugars.  Getting at least 2 hours of brisk physical activity every week.  Losing weight if necessary.  Not smoking.  Limiting alcoholic beverages.  Learning ways to reduce stress. If lifestyle changes are not enough to get your blood pressure under control, your health care provider may prescribe medicine. You may need to take more than one. Work closely with your health care provider to  understand the risks and benefits. HOME CARE INSTRUCTIONS  Have your blood pressure rechecked as directed by your health care provider.   Take medicines only as directed by your health care provider. Follow the directions carefully. Blood pressure medicines must be taken as prescribed. The medicine does not work as well when you skip doses. Skipping doses also puts you at risk for problems.   Do not smoke.   Monitor your blood pressure at home as directed by your health care provider. SEEK MEDICAL CARE IF:   You think you are having a reaction to medicines taken.  You have recurrent headaches or feel dizzy.  You have swelling in your ankles.  You have trouble with your vision. SEEK IMMEDIATE MEDICAL CARE IF:  You develop a severe headache or confusion.  You have unusual weakness, numbness, or feel faint.  You have severe chest or abdominal pain.  You vomit repeatedly.  You have trouble breathing. MAKE SURE YOU:   Understand these instructions.  Will watch your condition.  Will get help right away if you are not doing well or get worse. Document Released: 12/10/2005 Document Revised: 04/26/2014 Document Reviewed: 10/02/2013 [facility] Patient Information 2015 [place], [place]. This information is not intended to replace advice given to you by your health care provider. Make sure you discuss any questions you have with your health care provider.

## 2015-09-07 NOTE — ED Notes (Signed)
Pt sent by PCP for right sided tingling x1 week. States tingling was getting better until three days ago. Denies visual changes, HA, pain. Hx of HTN, no meds. A&O x4 in triage. Ambulatory in triage.

## 2015-09-07 NOTE — ED Notes (Signed)
Pt ambulating independently w/ steady gait on d/c in no acute distress, A&Ox4. D/c instructions reviewed w/ pt and family - pt and family deny any further questions or concerns at present. Rx given x1  

## 2015-09-07 NOTE — ED Notes (Signed)
Patient transported to CT 

## 2015-09-08 NOTE — ED Provider Notes (Signed)
CSN: [id number]     Arrival date & time 09/07/15  1852 History   First MD Initiated Contact with Patient 09/07/15 1934     Chief Complaint  Patient presents with  . Tingling     (Consider location/radiation/quality/duration/timing/severity/associated sxs/prior Treatment) Patient is a 49 y.o. male presenting with neurologic complaint. The history is provided by the patient.  Neurologic Problem This is a new problem. The current episode started more than 1 week ago. The problem occurs constantly. The problem has been gradually improving. Pertinent negatives include no chest pain, no abdominal pain and no shortness of breath. Nothing aggravates the symptoms. Nothing relieves the symptoms. He has tried nothing for the symptoms.    Past Medical History  Diagnosis Date  . Myocardial infarct 2008  . Nephrolithiasis   . Hypertension   . Anxiety   . Diverticulitis   . Vertigo   . CAD (coronary artery disease)    Past Surgical History  Procedure Laterality Date  . Anterior cruciate ligament repair  2006  . Coronary stent placement  2008  . Colectomy  12/18/2011    Sigmoid colectomy/Hartmann-Dr Gerkin  . Laparotomy  12/18/2011    Procedure: EXPLORATORY LAPAROTOMY;  Surgeon: Velora Heckler, MD;  Location: [facility];  Service: General;  Laterality: N/A;  . Colostomy revision  12/18/2011    Procedure: COLON RESECTION SIGMOID;  Surgeon: Velora Heckler, MD;  Location: [facility];  Service: General;  Laterality: N/A;  with end colostomy  . Wisdom tooth extraction    . Colostomy closure  04/09/2012    Procedure: COLOSTOMY CLOSURE;  Surgeon: Velora Heckler, MD;  Location: [facility];  Service: General;  Laterality: N/A;   Family History  Problem Relation Age of Onset  . Hypertension Mother   . Stroke Mother   . Heart failure Father     MI at age 5  . Hypertension Father    Social History  Substance Use Topics  . Smoking status: Current Some Day Smoker -- 0.25 packs/day for 20 years    Types:  Cigarettes  . Smokeless tobacco: Never Used  . Alcohol Use: 6.0 oz/week    10 Glasses of wine per week     Comment: occasional    Review of Systems  Respiratory: Negative for shortness of breath.   Cardiovascular: Negative for chest pain.  Gastrointestinal: Negative for abdominal pain.  All other systems reviewed and are negative.     Allergies  Codeine  Home Medications   Prior to Admission medications   Medication Sig Start Date End Date Taking? Authorizing Provider  acetaminophen (TYLENOL) 325 MG tablet Take 650 mg by mouth every 6 (six) hours as needed. Pain   Yes Historical Provider, MD  aspirin 81 MG tablet Take 81 mg by mouth daily before breakfast.    Yes Historical Provider, MD  lisinopril (PRINIVIL,ZESTRIL) 20 MG tablet Take 1 tablet (20 mg total) by mouth daily. 09/07/15   Lyndal Pulley, MD   BP 186/118 mmHg  Pulse 73  Temp(Src) 97.6 F (36.4 C) (Oral)  Resp 27  SpO2 94% Physical Exam  Constitutional: He is oriented to person, place, and time. He appears well-developed and well-nourished. No distress.  HENT:  Head: Normocephalic and atraumatic.  Eyes: Conjunctivae are normal.  Neck: Neck supple. No tracheal deviation present.  Cardiovascular: Normal rate, regular rhythm and normal heart sounds.   Pulmonary/Chest: Effort normal. No respiratory distress. He has no wheezes.  Abdominal: Soft. He exhibits no distension.  Neurological: He  is alert and oriented to person, place, and time. He has normal strength. No cranial nerve deficit or sensory deficit. Coordination and gait normal. GCS eye subscore is 4. GCS verbal subscore is 5. GCS motor subscore is 6.  Skin: Skin is warm and dry.  Psychiatric: He has a normal mood and affect.    ED Course  Procedures (including critical care time) Labs Review Labs Reviewed  BASIC METABOLIC PANEL - Abnormal; Notable for the following:    Calcium 8.8 (*)    All other components within normal limits  CBC WITH  DIFFERENTIAL/PLATELET - Abnormal; Notable for the following:    WBC 12.4 (*)    Lymphs Abs 5.1 (*)    Monocytes Absolute 1.1 (*)    All other components within normal limits    Imaging Review Ct Head Wo Contrast  09/07/2015   CLINICAL DATA:  Right-sided tingling 1 week improving until 3 days ago.  EXAM: CT HEAD WITHOUT CONTRAST  TECHNIQUE: Contiguous axial images were obtained from the base of the skull through the vertex without intravenous contrast.  COMPARISON:  None.  FINDINGS: Ventricles, cisterns and other CSF spaces are within normal. There is a 2 cm ovoid hypodensity over the left frontal region between the left frontal horn and insular cortex likely an old focal infarct. There is focal 8 mm hypodensity over the left thalamus as well as 7 mm hypodensity over the right external capsule likely old lacunar infarcts. There is no focal mass, mass effect, shift of midline structures or acute hemorrhage. No evidence of acute infarction. There is mild opacification over the ethmoid and right sphenoid sinus.  IMPRESSION: No acute intracranial findings.  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   Electronically Signed   By: Elberta Fortis M.D.   On: 09/07/2015 20:19   I have personally reviewed and evaluated these images and lab results as part of my medical decision-making.   EKG Interpretation   Date/Time:  Wednesday September 07 2015 19:31:22 EDT Ventricular Rate:  78 PR Interval:  147 QRS Duration: 95 QT Interval:  401 QTC Calculation: 457 R Axis:   4 Text Interpretation:  Sinus rhythm Atrial premature complexes Probable  left atrial enlargement Abnormal T, consider ischemia, lateral leads  Baseline wander in lead(s) V1 Confirmed by Lawana Hartzell MD, Louann Hopson ([id number]) on  09/07/2015 7:41:21 PM      MDM   Final diagnoses:  Chronic hypertension  Paresthesia    50 year old male presents with tingling  over his right arm, leg, face and lips over the last week. He has ongoing hypertension is untreated, prior coronary artery disease, has been lost to follow-up. He presented to an urgent care today and was found to be hypertensive. He is no focal neurologic deficits, no sensory deficits, no difficulty with ambulation. Screening head CT was performed to rule out intracranial hemorrhage or stroke. No acute abnormalities with remote microvascular ischemic changes evident. No significant metabolic or hematologic abnormalities to explain the patient's symptoms. No cardiac symptoms. Symptoms improved and patient not complaining of any focal abnormalities with no treatment. I recommended that he start a regimen of antihypertensive medication and establish a primary care physician for his ongoing chronic medical problems. Strict return precautions were discussed for new or worsening concerning symptoms.    Lyndal Pulley, MD 09/08/15 [phone]

## 2015-09-29 ENCOUNTER — Encounter: Payer: Self-pay | Admitting: Family Medicine

## 2015-09-29 ENCOUNTER — Ambulatory Visit (INDEPENDENT_AMBULATORY_CARE_PROVIDER_SITE_OTHER): Payer: Self-pay | Admitting: Family Medicine

## 2015-09-29 VITALS — BP 164/112 | HR 84 | Temp 97.8°F | Resp 16 | Ht 69.0 in | Wt 204.0 lb

## 2015-09-29 DIAGNOSIS — R635 Abnormal weight gain: Secondary | ICD-10-CM

## 2015-09-29 DIAGNOSIS — Z0000 Encounter for general adult medical examination without abnormal findings: Secondary | ICD-10-CM

## 2015-09-29 DIAGNOSIS — F17200 Nicotine dependence, unspecified, uncomplicated: Secondary | ICD-10-CM

## 2015-09-29 DIAGNOSIS — G629 Polyneuropathy, unspecified: Secondary | ICD-10-CM

## 2015-09-29 DIAGNOSIS — I10 Essential (primary) hypertension: Secondary | ICD-10-CM

## 2015-09-29 LAB — COMPLETE METABOLIC PANEL WITH GFR
ALBUMIN: 4.2 g/dL (ref 3.6–5.1)
ALT: 17 U/L (ref 9–46)
AST: 14 U/L (ref 10–35)
Alkaline Phosphatase: 63 U/L (ref 40–115)
BILIRUBIN TOTAL: 0.4 mg/dL (ref 0.2–1.2)
BUN: 15 mg/dL (ref 7–25)
CALCIUM: 8.8 mg/dL (ref 8.6–10.3)
CO2: 23 mmol/L (ref 20–31)
CREATININE: 0.8 mg/dL (ref 0.70–1.33)
Chloride: 106 mmol/L (ref 98–110)
GFR, Est Non African American: >89 mL/min (ref >=60)
Glucose, Bld: 87 mg/dL (ref 65–99)
Potassium: 4.7 mmol/L (ref 3.5–5.3)
Sodium: 137 mmol/L (ref 135–146)
TOTAL PROTEIN: 6.5 g/dL (ref 6.1–8.1)

## 2015-09-29 LAB — POCT URINALYSIS DIP (DEVICE)
BILIRUBIN URINE: NEGATIVE
Glucose, UA: NEGATIVE mg/dL
HGB URINE DIPSTICK: NEGATIVE
KETONES UR: NEGATIVE mg/dL
Leukocytes, UA: NEGATIVE
Nitrite: NEGATIVE
PH: 5.5 (ref 5.0–8.0)
Protein, ur: NEGATIVE mg/dL
Specific Gravity, Urine: 1.015 (ref 1.005–1.030)
Urobilinogen, UA: 0.2 mg/dL (ref 0.0–1.0)

## 2015-09-29 LAB — CBC WITH DIFFERENTIAL/PLATELET
BASOS ABS: 0 10*3/uL (ref 0.0–0.1)
Basophils Relative: 0 % (ref 0–1)
EOS PCT: 4 % (ref 0–5)
Eosinophils Absolute: 0.6 10*3/uL (ref 0.0–0.7)
HEMATOCRIT: 48.1 % (ref 39.0–52.0)
Hemoglobin: 16.9 g/dL (ref 13.0–17.0)
LYMPHS ABS: 4.3 10*3/uL — AB (ref 0.7–4.0)
LYMPHS PCT: 31 % (ref 12–46)
MCH: 31.8 pg (ref 26.0–34.0)
MCHC: 35.1 g/dL (ref 30.0–36.0)
MCV: 90.4 fL (ref 78.0–100.0)
MONOS PCT: 7 % (ref 3–12)
MPV: 12 fL (ref 8.6–12.4)
Monocytes Absolute: 1 10*3/uL (ref 0.1–1.0)
Neutro Abs: 8.1 10*3/uL — ABNORMAL HIGH (ref 1.7–7.7)
Neutrophils Relative %: 58 % (ref 43–77)
Platelets: 163 10*3/uL (ref 150–400)
RBC: 5.32 MIL/uL (ref 4.22–5.81)
RDW: 13.8 % (ref 11.5–15.5)
WBC: 14 10*3/uL — ABNORMAL HIGH (ref 4.0–10.5)

## 2015-09-29 LAB — LIPID PANEL
CHOLESTEROL: 163 mg/dL (ref 125–200)
HDL: 26 mg/dL — ABNORMAL LOW (ref >=40)
LDL Cholesterol: 119 mg/dL (ref <130)
TRIGLYCERIDES: 91 mg/dL (ref <150)
Total CHOL/HDL Ratio: 6.3 Ratio — ABNORMAL HIGH (ref <=5.0)
VLDL: 18 mg/dL (ref <30)

## 2015-09-29 LAB — TSH: TSH: 1.183 u[IU]/mL (ref 0.350–4.500)

## 2015-09-29 MED ORDER — GABAPENTIN 100 MG PO CAPS: 100.0000 mg | ORAL_CAPSULE | Freq: Two times a day (BID) | ORAL | Status: DC

## 2015-09-29 MED ORDER — AMLODIPINE BESYLATE 5 MG PO TABS: 5.0000 mg | ORAL_TABLET | Freq: Every day | ORAL | Status: DC

## 2015-09-29 NOTE — Patient Instructions (Addendum)
Check blood pressures at home daily Take Amlodipine 5 mg daily consistently Continue Lisinopril as previously prescribed Start DASH diet Increase water intake to 5-6 glasses per day Peripheral Neuropathy Peripheral neuropathy is a type of nerve damage. It affects nerves that carry signals between the spinal cord and other parts of the body. These are called peripheral nerves. With peripheral neuropathy, one nerve or a group of nerves may be damaged.  CAUSES  Many things can damage peripheral nerves. For some people with peripheral neuropathy, the cause is unknown. Some causes include:  Diabetes. This is the most common cause of peripheral neuropathy.  Injury to a nerve.  Pressure or stress on a nerve that lasts a long time.  Too little vitamin B. Alcoholism can lead to this.  Infections.  Autoimmune diseases, such as multiple sclerosis and systemic lupus erythematosus.  Inherited nerve diseases.  Some medicines, such as cancer drugs.  Toxic substances, such as lead and mercury.  Too little blood flowing to the legs.  Kidney disease.  Thyroid disease. SIGNS AND SYMPTOMS  Different people have different symptoms. The symptoms you have will depend on which of your nerves is damaged. Common symptoms include:  Loss of feeling (numbness) in the feet and hands.  Tingling in the feet and hands.  Pain that burns.  Very sensitive skin.  Weakness.  Not being able to move a part of the body (paralysis).  Muscle twitching.  Clumsiness or poor coordination.  Loss of balance.  Not being able to control your bladder.  Feeling dizzy.  Sexual problems. DIAGNOSIS  Peripheral neuropathy is a symptom, not a disease. Finding the cause of peripheral neuropathy can be hard. To figure that out, your health care provider will take a medical history and do a physical exam. A neurological exam will also be done. This involves checking things affected by your brain, spinal cord, and  nerves (nervous system). For example, your health care provider will check your reflexes, how you move, and what you can feel.  Other types of tests may also be ordered, such as:  Blood tests.  A test of the fluid in your spinal cord.  Imaging tests, such as CT scans or an MRI.  Electromyography (EMG). This test checks the nerves that control muscles.  Nerve conduction velocity tests. These tests check how fast messages pass through your nerves.  Nerve biopsy. A small piece of nerve is removed. It is then checked under a microscope. TREATMENT   Medicine is often used to treat peripheral neuropathy. Medicines may include:  Pain-relieving medicines. Prescription or over-the-counter medicine may be suggested.  Antiseizure medicine. This may be used for pain.  Antidepressants. These also may help ease pain from neuropathy.  Lidocaine. This is a numbing medicine. You might wear a patch or be given a shot.  Mexiletine. This medicine is typically used to help control irregular heart rhythms.  Surgery. Surgery may be needed to relieve pressure on a nerve or to destroy a nerve that is causing pain.  Physical therapy to help movement.  Assistive devices to help movement. HOME CARE INSTRUCTIONS   Only take over-the-counter or prescription medicines as directed by your health care provider. Follow the instructions carefully for any given medicines. Do not take any other medicines without first getting approval from your health care provider.  If you have diabetes, work closely with your health care provider to keep your blood sugar under control.  If you have numbness in your feet:  Check every day  for signs of injury or infection. Watch for redness, warmth, and swelling.  Wear padded socks and comfortable shoes. These help protect your feet.  Do not do things that put pressure on your damaged nerve.  Do not smoke. Smoking keeps blood from getting to damaged nerves.  Avoid or  limit alcohol. Too much alcohol can cause a lack of B vitamins. These vitamins are needed for healthy nerves.  Develop a good support system. Coping with peripheral neuropathy can be stressful. Talk to a mental health specialist or join a support group if you are struggling.  Follow up with your health care provider as directed. SEEK MEDICAL CARE IF:   You have new signs or symptoms of peripheral neuropathy.  You are struggling emotionally from dealing with peripheral neuropathy.  You have a fever. SEEK IMMEDIATE MEDICAL CARE IF:   You have an injury or infection that is not healing.  You feel very dizzy or begin vomiting.  You have chest pain.  You have trouble breathing.   This information is not intended to replace advice given to you by your health care provider. Make sure you discuss any questions you have with your health care provider.   Document Released: 11/30/2002 Document Revised: 08/22/2011 Document Reviewed: 08/17/2013 Elsevier Interactive Patient Education 2016 Elsevier Inc[employer] DASH Eating Plan DASH stands for "Dietary Approaches to Stop Hypertension." The DASH eating plan is a healthy eating plan that has been shown to reduce high blood pressure (hypertension). Additional health benefits may include reducing the risk of type 2 diabetes mellitus, heart disease, and stroke. The DASH eating plan may also help with weight loss. WHAT DO I NEED TO KNOW ABOUT THE DASH EATING PLAN? For the DASH eating plan, you will follow these general guidelines:  Choose foods with a percent daily value for sodium of less than 5% (as listed on the food label).  Use salt-free seasonings or herbs instead of table salt or sea salt.  Check with your health care provider or pharmacist before using salt substitutes.  Eat lower-sodium products, often labeled as "lower sodium" or "no salt added."  Eat fresh foods.  Eat more vegetables, fruits, and low-fat dairy products.  Choose whole  grains. Look for the word "whole" as the first word in the ingredient list.  Choose fish and skinless chicken or [place] more often than red meat. Limit fish, poultry, and meat to 6 oz (170 g) each day.  Limit sweets, desserts, sugars, and sugary drinks.  Choose heart-healthy fats.  Limit cheese to 1 oz (28 g) per day.  Eat more home-cooked food and less restaurant, buffet, and fast food.  Limit fried foods.  Cook foods using methods other than frying.  Limit canned vegetables. If you do use them, rinse them well to decrease the sodium.  When eating at a restaurant, ask that your food be prepared with less salt, or no salt if possible. WHAT FOODS CAN I EAT? Seek help from a dietitian for individual calorie needs. Grains Whole grain or whole wheat bread. Brown rice. Whole grain or whole wheat pasta. Quinoa, bulgur, and whole grain cereals. Low-sodium cereals. Corn or whole wheat flour tortillas. Whole grain cornbread. Whole grain crackers. Low-sodium crackers. Vegetables Fresh or frozen vegetables (raw, steamed, roasted, or grilled). Low-sodium or reduced-sodium tomato and vegetable juices. Low-sodium or reduced-sodium tomato sauce and paste. Low-sodium or reduced-sodium canned vegetables.  Fruits All fresh, canned (in natural juice), or frozen fruits. Meat and Other Protein Products Ground beef (85% or leaner),  grass-fed beef, or beef trimmed of fat. Skinless chicken or [place]. Ground chicken or [place]. Pork trimmed of fat. All fish and seafood. Eggs. Dried beans, peas, or lentils. Unsalted nuts and seeds. Unsalted canned beans. Dairy Low-fat dairy products, such as skim or 1% milk, 2% or reduced-fat cheeses, low-fat ricotta or cottage cheese, or plain low-fat yogurt. Low-sodium or reduced-sodium cheeses. Fats and Oils Tub margarines without trans fats. Light or reduced-fat mayonnaise and salad dressings (reduced sodium). Avocado. Safflower, olive, or canola oils. Natural peanut or  almond butter. Other Unsalted popcorn and pretzels. The items listed above may not be a complete list of recommended foods or beverages. Contact your dietitian for more options. WHAT FOODS ARE NOT RECOMMENDED? Grains White bread. White pasta. White rice. Refined cornbread. Bagels and croissants. Crackers that contain trans fat. Vegetables Creamed or fried vegetables. Vegetables in a cheese sauce. Regular canned vegetables. Regular canned tomato sauce and paste. Regular tomato and vegetable juices. Fruits Dried fruits. Canned fruit in light or heavy syrup. Fruit juice. Meat and Other Protein Products Fatty cuts of meat. Ribs, chicken wings, bacon, sausage, bologna, salami, chitterlings, fatback, hot dogs, bratwurst, and packaged luncheon meats. Salted nuts and seeds. Canned beans with salt. Dairy Whole or 2% milk, cream, half-and-half, and cream cheese. Whole-fat or sweetened yogurt. Full-fat cheeses or blue cheese. Nondairy creamers and whipped toppings. Processed cheese, cheese spreads, or cheese curds. Condiments Onion and garlic salt, seasoned salt, table salt, and sea salt. Canned and packaged gravies. Worcestershire sauce. Tartar sauce. Barbecue sauce. Teriyaki sauce. Soy sauce, including reduced sodium. Steak sauce. Fish sauce. Oyster sauce. Cocktail sauce. Horseradish. Ketchup and mustard. Meat flavorings and tenderizers. Bouillon cubes. Hot sauce. Tabasco sauce. Marinades. Taco seasonings. Relishes. Fats and Oils Butter, stick margarine, lard, shortening, ghee, and bacon fat. Coconut, palm kernel, or palm oils. Regular salad dressings. Other Pickles and olives. Salted popcorn and pretzels. The items listed above may not be a complete list of foods and beverages to avoid. Contact your dietitian for more information. WHERE CAN I FIND MORE INFORMATION? National Heart, Lung, and Blood Institute: [employer]   This information is not intended to  replace advice given to you by your health care provider. Make sure you discuss any questions you have with your health care provider.   Document Released: 11/29/2011 Document Revised: 12/31/2014 Document Reviewed: 10/14/2013 Elsevier Interactive Patient Education 2016 Elsevier Inc. DASH Eating Plan DASH stands for "Dietary Approaches to Stop Hypertension." The DASH eating plan is a healthy eating plan that has been shown to reduce high blood pressure (hypertension). Additional health benefits may include reducing the risk of type 2 diabetes mellitus, heart disease, and stroke. The DASH eating plan may also help with weight loss. WHAT DO I NEED TO KNOW ABOUT THE DASH EATING PLAN? For the DASH eating plan, you will follow these general guidelines:  Choose foods with a percent daily value for sodium of less than 5% (as listed on the food label).  Use salt-free seasonings or herbs instead of table salt or sea salt.  Check with your health care provider or pharmacist before using salt substitutes.  Eat lower-sodium products, often labeled as "lower sodium" or "no salt added."  Eat fresh foods.  Eat more vegetables, fruits, and low-fat dairy products.  Choose whole grains. Look for the word "whole" as the first word in the ingredient list.  Choose fish and skinless chicken or [place] more often than red meat. Limit fish, poultry, and meat to 6 oz (170 g)  each day.  Limit sweets, desserts, sugars, and sugary drinks.  Choose heart-healthy fats.  Limit cheese to 1 oz (28 g) per day.  Eat more home-cooked food and less restaurant, buffet, and fast food.  Limit fried foods.  Cook foods using methods other than frying.  Limit canned vegetables. If you do use them, rinse them well to decrease the sodium.  When eating at a restaurant, ask that your food be prepared with less salt, or no salt if possible. WHAT FOODS CAN I EAT? Seek help from a dietitian for individual calorie  needs. Grains Whole grain or whole wheat bread. Brown rice. Whole grain or whole wheat pasta. Quinoa, bulgur, and whole grain cereals. Low-sodium cereals. Corn or whole wheat flour tortillas. Whole grain cornbread. Whole grain crackers. Low-sodium crackers. Vegetables Fresh or frozen vegetables (raw, steamed, roasted, or grilled). Low-sodium or reduced-sodium tomato and vegetable juices. Low-sodium or reduced-sodium tomato sauce and paste. Low-sodium or reduced-sodium canned vegetables.  Fruits All fresh, canned (in natural juice), or frozen fruits. Meat and Other Protein Products Ground beef (85% or leaner), grass-fed beef, or beef trimmed of fat. Skinless chicken or [place]. Ground chicken or [place]. Pork trimmed of fat. All fish and seafood. Eggs. Dried beans, peas, or lentils. Unsalted nuts and seeds. Unsalted canned beans. Dairy Low-fat dairy products, such as skim or 1% milk, 2% or reduced-fat cheeses, low-fat ricotta or cottage cheese, or plain low-fat yogurt. Low-sodium or reduced-sodium cheeses. Fats and Oils Tub margarines without trans fats. Light or reduced-fat mayonnaise and salad dressings (reduced sodium). Avocado. Safflower, olive, or canola oils. Natural peanut or almond butter. Other Unsalted popcorn and pretzels. The items listed above may not be a complete list of recommended foods or beverages. Contact your dietitian for more options. WHAT FOODS ARE NOT RECOMMENDED? Grains White bread. White pasta. White rice. Refined cornbread. Bagels and croissants. Crackers that contain trans fat. Vegetables Creamed or fried vegetables. Vegetables in a cheese sauce. Regular canned vegetables. Regular canned tomato sauce and paste. Regular tomato and vegetable juices. Fruits Dried fruits. Canned fruit in light or heavy syrup. Fruit juice. Meat and Other Protein Products Fatty cuts of meat. Ribs, chicken wings, bacon, sausage, bologna, salami, chitterlings, fatback, hot dogs, bratwurst,  and packaged luncheon meats. Salted nuts and seeds. Canned beans with salt. Dairy Whole or 2% milk, cream, half-and-half, and cream cheese. Whole-fat or sweetened yogurt. Full-fat cheeses or blue cheese. Nondairy creamers and whipped toppings. Processed cheese, cheese spreads, or cheese curds. Condiments Onion and garlic salt, seasoned salt, table salt, and sea salt. Canned and packaged gravies. Worcestershire sauce. Tartar sauce. Barbecue sauce. Teriyaki sauce. Soy sauce, including reduced sodium. Steak sauce. Fish sauce. Oyster sauce. Cocktail sauce. Horseradish. Ketchup and mustard. Meat flavorings and tenderizers. Bouillon cubes. Hot sauce. Tabasco sauce. Marinades. Taco seasonings. Relishes. Fats and Oils Butter, stick margarine, lard, shortening, ghee, and bacon fat. Coconut, palm kernel, or palm oils. Regular salad dressings. Other Pickles and olives. Salted popcorn and pretzels. The items listed above may not be a complete list of foods and beverages to avoid. Contact your dietitian for more information. WHERE CAN I FIND MORE INFORMATION? National Heart, Lung, and Blood Institute: [employer]it   This information is not intended to replace advice given to you by your health care provider. Make sure you discuss any questions you have with your health care provider.   Document Released: 11/29/2011 Document Revised: 12/31/2014 Document Reviewed: 10/14/2013 Elsevier Interactive Patient Education [employer]

## 2015-09-29 NOTE — Progress Notes (Signed)
Subjective:    Patient ID: Ricardo Stone, male    DOB: 1966/11/07, 49 y.o.   MRN: [id number]  HPI   [name], a 48 year old male presents to establish care. He states that he has not consistently been followed by a primary provider due to insurance constraints.  He reports a history of hypertension and right side tingling for the past several weeks. He was evaluated in the emergency department on 09/07/2015. He was evaluated for stroke due to ongoing numbness, tingling and burning pain to right face, right upper and lower extremities. Patient had a CT to rule out intercranial hemorrhage or stroke, which was unremarkable. He has a history of uncontrolled hypertension. He was started on anti-hypertensive therapy (Lisinopril) in the emergency department. He states that he has been taking medications consistently over the past several weeks. He is not following a consistent diet or exercise regimen. He current denies fatigue, shortness of breath, dizziness, chest pains, or heart palpitations. He reports that he continues to smoke 0.5 packs per day. He reports that he is decreasing daily smoking amount. He has been smoking cigarettes for the past 30 years.   Past Medical History  Diagnosis Date  . Myocardial infarct 2008  . Nephrolithiasis   . Hypertension   . Anxiety   . Diverticulitis   . Vertigo   . CAD (coronary artery disease)    Social History   Social History  . Marital Status: Divorced    Spouse Name: N/A  . Number of Children: 2  . Years of Education: N/A   Occupational History  . Curator    Social History Main Topics  . Smoking status: Current Some Day Smoker -- 0.25 packs/day for 20 years    Types: Cigarettes  . Smokeless tobacco: Never Used  . Alcohol Use: 6.0 oz/week    10 Glasses of wine per week     Comment: occasional  . Drug Use: No  . Sexual Activity: Not on file   Other Topics Concern  . Not on file   Social History Narrative   There is no  immunization history on file for this patient.  Allergies  Allergen Reactions  . Codeine     Patient gets adggitated   Graphstesi Review of Systems  Constitutional: Negative.   HENT: Negative.   Eyes: Negative.   Respiratory: Negative.   Cardiovascular: Negative.   Gastrointestinal: Negative.   Endocrine: Positive for cold intolerance. Negative for polydipsia, polyphagia and polyuria.  Genitourinary: Negative.   Musculoskeletal: Negative.   Skin: Negative.   Neurological: Negative.  Negative for dizziness, facial asymmetry and headaches.  Hematological: Negative.   Psychiatric/Behavioral: Negative.        Objective:   Physical Exam  Constitutional: He is oriented to person, place, and time. Vital signs are normal. He appears well-developed and well-nourished.  HENT:  Head: Normocephalic and atraumatic.  Right Ear: Hearing, tympanic membrane, external ear and ear canal normal.  Left Ear: Hearing, tympanic membrane, external ear and ear canal normal.  Nose: Nose normal.  Mouth/Throat: Uvula is midline and oropharynx is clear and moist.  Eyes: Conjunctivae, EOM and lids are normal. Pupils are equal, round, and reactive to light.  Neck: Trachea normal and normal range of motion. Neck supple.  Cardiovascular: Normal rate, regular rhythm, S1 normal, S2 normal, normal heart sounds, intact distal pulses and normal pulses.   Pulmonary/Chest: Effort normal and breath sounds normal.  Abdominal: Soft. Bowel sounds are normal.  Musculoskeletal: Normal  range of motion.  Neurological: He is alert and oriented to person, place, and time. He has normal strength and normal reflexes. No cranial nerve deficit or sensory deficit. He displays a negative Romberg sign.  Skin: Skin is warm and dry.  Psychiatric: He has a normal mood and affect. His behavior is normal. Judgment and thought content normal.     BP 164/112 mmHg  Pulse 84  Temp(Src) 97.8 F (36.6 C) (Oral)  Resp 16  Ht  (1.753  m)  Wt 204 lb (92.534 kg)  BMI 30.11 kg/m2 Assessment & Plan:  1. Uncontrolled hypertension Blood pressure is above goal on current medication regimen. Will add amlodipine to current medication regimen. Will review urinalysis for proteinuria. The patient is asked to make an attempt to improve diet and exercise patterns to aid in medical management of this problem. - amLODipine (NORVASC) 5 MG tablet; Take 1 tablet (5 mg total) by mouth daily.  Dispense: 30 tablet; Refill: 0 - POCT urinalysis dipstick  2. Neuropathy [facility]) Patient complaining of peripheral neuropathy to right upper and lower extremities. Patient was evaluated in the emergency department recently for a stroke. All images were unremarkable upon review. Will start a 1 month trial of gabapentin. Will also screen for diabetes mellitus.  - gabapentin (NEURONTIN) 100 MG capsule; Take 1 capsule (100 mg total) by mouth 2 (two) times daily.  Dispense: 60 capsule; Refill: 0  3. Weight gain Recommend a lowfat, low carbohydrate diet divided over 5-6 small meals, increase water intake to 6-8 glasses, and 150 minutes per week of cardiovascular exercise.    4. Health care maintenance Patient refused immunizations.  Will schedule CPE w/prostate exam  Recommend colonoscopy  - Lipid Panel - COMPLETE METABOLIC PANEL WITH GFR - TSH - Hemoglobin A1c - CBC with Differential  5. Tobacco dependence Smoking cessation instruction/counseling given:  counseled patient on the dangers of tobacco use, advised patient to stop smoking, and reviewed strategies to maximize success  RTC: 1 month for uncontrolled hypertension The patient was given clear instructions to go to ER or return to medical center if symptoms do not improve, worsen or new problems develop. The patient verbalized understanding. Will notify patient with laboratory results. Massie Maroon, FNP

## 2015-09-30 LAB — HEMOGLOBIN A1C
HEMOGLOBIN A1C: 5.7 % — AB (ref <5.7)
Mean Plasma Glucose: 117 mg/dL — ABNORMAL HIGH (ref <117)

## 2015-10-02 ENCOUNTER — Telehealth: Payer: Self-pay | Admitting: Family Medicine

## 2015-10-02 NOTE — Telephone Encounter (Signed)
Reviewed labs, hemoglobin a1C is 5.7, which is consistent with pre-diabetes. Recommend a lowfat, low carbohydrate diet divided over 5-6 small meals, increase water intake to 6-8 glasses, and 150 minutes per week of cardiovascular exercise.  Will re-evaluate laboratory values in 3 months.   Mild leukocytosis present. Recommend that patient follow-up as scheduled.     Massie Maroon, FNP

## 2015-10-03 NOTE — Telephone Encounter (Signed)
Left message for patient to call back regarding labs. Thanks!  

## 2015-10-03 NOTE — Telephone Encounter (Signed)
Patient call back and I advised him of lab work and china's instructions. Patient had no further questions at this time. Thanks!

## 2015-10-06 ENCOUNTER — Other Ambulatory Visit: Payer: Self-pay

## 2015-10-11 ENCOUNTER — Other Ambulatory Visit: Payer: Self-pay | Admitting: *Deleted

## 2015-10-11 ENCOUNTER — Telehealth: Payer: Self-pay | Admitting: *Deleted

## 2015-10-11 NOTE — Telephone Encounter (Signed)
Patient is requesting a refill of albuterol inhaler, this is not in his chart. Please advise. LOV 09/29/2015.Thanks!

## 2015-10-11 NOTE — Telephone Encounter (Signed)
Patients Lisinopril was refilled via phone with Judeth CornfieldStephanie from [facility].  Lisinopril 20 mg tablet taken once daily dispensed 30 tablets with 3 refills

## 2015-10-18 ENCOUNTER — Telehealth: Payer: Self-pay | Admitting: Family Medicine

## 2015-10-18 NOTE — Telephone Encounter (Signed)
Patient would like a call regarding right sided pain. Face, hand, and foot.

## 2015-10-18 NOTE — Telephone Encounter (Signed)
Called and spoke with patient. Patient states nerve pain is not getting better. It is in hip area going down leg. Patient denies pain in face, arms, chest. Patients denies any sob. Only complain is pain (numbness in leg). Appointment is scheduled for Tomorrow 10/19/2015 @10 :45am for re-evaluation. Thanks!

## 2015-10-19 ENCOUNTER — Ambulatory Visit (INDEPENDENT_AMBULATORY_CARE_PROVIDER_SITE_OTHER): Payer: Self-pay | Admitting: Family Medicine

## 2015-10-19 VITALS — BP 142/90 | HR 98 | Temp 97.8°F | Resp 16 | Ht 69.0 in | Wt 201.0 lb

## 2015-10-19 DIAGNOSIS — G629 Polyneuropathy, unspecified: Secondary | ICD-10-CM

## 2015-10-19 DIAGNOSIS — D72829 Elevated white blood cell count, unspecified: Secondary | ICD-10-CM

## 2015-10-19 DIAGNOSIS — I10 Essential (primary) hypertension: Secondary | ICD-10-CM

## 2015-10-19 DIAGNOSIS — F17200 Nicotine dependence, unspecified, uncomplicated: Secondary | ICD-10-CM

## 2015-10-19 DIAGNOSIS — R5383 Other fatigue: Secondary | ICD-10-CM

## 2015-10-19 LAB — CBC WITH DIFFERENTIAL/PLATELET
BASOS ABS: 0 10*3/uL (ref 0.0–0.1)
BASOS PCT: 0 % (ref 0–1)
EOS ABS: 0.3 10*3/uL (ref 0.0–0.7)
EOS PCT: 3 % (ref 0–5)
HCT: 48.7 % (ref 39.0–52.0)
Hemoglobin: 16.8 g/dL (ref 13.0–17.0)
LYMPHS PCT: 38 % (ref 12–46)
Lymphs Abs: 4.3 10*3/uL — ABNORMAL HIGH (ref 0.7–4.0)
MCH: 31.4 pg (ref 26.0–34.0)
MCHC: 34.5 g/dL (ref 30.0–36.0)
MCV: 91 fL (ref 78.0–100.0)
MPV: 11.9 fL (ref 8.6–12.4)
Monocytes Absolute: 0.9 10*3/uL (ref 0.1–1.0)
Monocytes Relative: 8 % (ref 3–12)
Neutro Abs: 5.8 10*3/uL (ref 1.7–7.7)
Neutrophils Relative %: 51 % (ref 43–77)
PLATELETS: 218 10*3/uL (ref 150–400)
RBC: 5.35 MIL/uL (ref 4.22–5.81)
RDW: 13.5 % (ref 11.5–15.5)
WBC: 11.3 10*3/uL — AB (ref 4.0–10.5)

## 2015-10-19 LAB — POCT URINALYSIS DIP (DEVICE)
BILIRUBIN URINE: NEGATIVE
GLUCOSE, UA: NEGATIVE mg/dL
Hgb urine dipstick: NEGATIVE
Ketones, ur: NEGATIVE mg/dL
LEUKOCYTES UA: NEGATIVE
Nitrite: NEGATIVE
Protein, ur: NEGATIVE mg/dL
Specific Gravity, Urine: 1.015 (ref 1.005–1.030)
UROBILINOGEN UA: 0.2 mg/dL (ref 0.0–1.0)
pH: 6 (ref 5.0–8.0)

## 2015-10-19 MED ORDER — AMLODIPINE BESYLATE 5 MG PO TABS: 5.0000 mg | ORAL_TABLET | Freq: Every day | ORAL | Status: DC

## 2015-10-19 MED ORDER — GABAPENTIN 300 MG PO CAPS: 300.0000 mg | ORAL_CAPSULE | Freq: Three times a day (TID) | ORAL | Status: DC

## 2015-10-19 NOTE — Progress Notes (Signed)
Subjective:    Patient ID: Ricardo BustleJoseph R Stone, male    DOB: [birth year], 49 y.o.   MRN: [id number]  HPI   Ricardo Stone, a 49 year old male presents for 1 month follow up of hypertension and neuropathy. Patient was started on Amlodipine 5 mg 1 month ago.  He states that he has been taking medications consistently. He has not been exercising, but has been following a a low sodium. He continues to smoke 0.5 pack of cigarettes daily.He current denies fatigue, shortness of breath, dizziness, chest pains, or heart palpitations.       He continues to have tingling to right upper and lower extremities. He states that gabapentin is working minimally. He was started on trial of gabapentin 100 mg BID.  He was previously evaluated for stroke due to ongoing numbness, tingling and burning pain to right face, right upper and lower extremities. Patient had a CT to rule out intercranial hemorrhage or stroke, which was unremarkable.  Past Medical History  Diagnosis Date  . Myocardial infarct (HCC) 2008  . Nephrolithiasis   . Hypertension   . Anxiety   . Diverticulitis   . Vertigo   . CAD (coronary artery disease)   . Stroke [facility](HCC)    Social History   Social History  . Marital Status: Divorced    Spouse Name: N/A  . Number of Children: 2  . Years of Education: N/A   Occupational History  . CuratorMECHANIC    Social History Main Topics  . Smoking status: Current Some Day Smoker -- 0.25 packs/day for 20 years    Types: Cigarettes  . Smokeless tobacco: Never Used  . Alcohol Use: 6.0 oz/week    10 Glasses of wine per week     Comment: occasional  . Drug Use: No  . Sexual Activity: Not on file   Other Topics Concern  . Not on file   Social History Narrative   There is no immunization history on file for this patient.  Allergies  Allergen Reactions  . Codeine     Patient gets adggitated   Graphstesi Review of Systems  Constitutional: Negative.  Negative for fever, diaphoresis and fatigue.   HENT: Negative.   Eyes: Negative.   Respiratory: Negative.  Negative for chest tightness and shortness of breath.   Cardiovascular: Negative.  Negative for chest pain, palpitations and leg swelling.  Gastrointestinal: Negative.  Negative for nausea, vomiting and blood in stool.  Endocrine: Negative for cold intolerance, heat intolerance, polydipsia, polyphagia and polyuria.  Genitourinary: Negative.   Musculoskeletal: Positive for myalgias.  Skin: Negative.   Allergic/Immunologic: Negative for immunocompromised state.  Neurological: Positive for numbness (right upper and lower extremities). Negative for dizziness, facial asymmetry and headaches.  Hematological: Negative.   Psychiatric/Behavioral: Negative.  Negative for suicidal ideas and sleep disturbance.       Objective:   Physical Exam  Constitutional: He is oriented to person, place, and time. Vital signs are normal. He appears well-developed and well-nourished.  HENT:  Head: Normocephalic and atraumatic.  Right Ear: Hearing, tympanic membrane, external ear and ear canal normal.  Left Ear: Hearing, tympanic membrane, external ear and ear canal normal.  Nose: Nose normal.  Mouth/Throat: Uvula is midline and oropharynx is clear and moist.  Eyes: Conjunctivae, EOM and lids are normal. Pupils are equal, round, and reactive to light.  Neck: Trachea normal and normal range of motion. Neck supple.  Cardiovascular: Normal rate, regular rhythm, S1 normal, S2 normal, normal heart sounds,  intact distal pulses and normal pulses.   Pulmonary/Chest: Effort normal and breath sounds normal.  Abdominal: Soft. Bowel sounds are normal.  Wears abdominal binder to prevent hernia reoccurrence.   Musculoskeletal: Normal range of motion.  Neurological: He is alert and oriented to person, place, and time. He has normal strength and normal reflexes. He displays no atrophy and no tremor. No cranial nerve deficit or sensory deficit. He exhibits normal  muscle tone. He displays a negative Romberg sign.  Skin: Skin is warm and dry.  Psychiatric: He has a normal mood and affect. His behavior is normal. Judgment and thought content normal.     BP 142/90 mmHg  Pulse 98  Temp(Src) 97.8 F (36.6 C) (Oral)  Resp 16  Ht  (1.753 m)  Wt 201 lb (91.173 kg)  BMI 29.67 kg/m2  SpO2 99% Assessment & Plan:   1. Neuropathy (HCC) Will increase gabapentin for neuropathy, lower dosage improved symptoms minimally. Will follow up by phone in 2 weeks.  - gabapentin (NEURONTIN) 300 MG capsule; Take 1 capsule (300 mg total) by mouth 3 (three) times daily.  Dispense: 90 capsule; Refill: 0  2. Essential hypertension Blood pressure has improved on current medication regimen. Reviewed urinalysis, no proteinuria present. The patient is asked to make an attempt to improve diet and exercise patterns to aid in medical management of this problem. - POCT urinalysis dipstick - amLODipine (NORVASC) 5 MG tablet; Take 1 tablet (5 mg total) by mouth daily.  Dispense: 30 tablet; Refill: 5  3. Other fatigue  - CBC with Differential  4. Leukocytosis WBC count was 14.0 two weeks ago, will check CBD.  - CBC with Differential  5. Tobacco dependency Smoking cessation instruction/counseling given:  counseled patient on the dangers of tobacco use, advised patient to stop smoking, and reviewed strategies to maximize success    RTC: Will follow up by phone in 2 weeks for neuropathy The patient was given clear instructions to go to ER or return to medical center if symptoms do not improve, worsen or new problems develop. The patient verbalized understanding. Will notify patient with laboratory results. Massie Maroon, FNP

## 2015-10-20 ENCOUNTER — Telehealth: Payer: Self-pay

## 2015-10-20 ENCOUNTER — Encounter: Payer: Self-pay | Admitting: Family Medicine

## 2015-10-20 NOTE — Telephone Encounter (Signed)
Called and spoke with patient advised of wbc count and that provider will contact him in two weeks to discuss trial of gabapentin. Thanks!

## 2015-10-20 NOTE — Telephone Encounter (Signed)
-----   Message from Massie MaroonLachina M Hollis, [place]NP sent at 10/20/2015  8:03 AM EDT ----- Please inform patient that white blood cell count is trending down per CBC results. He and I will follow up in 2 weeks by phone to discuss trial of gabapentin.   Thanks! ----- Message -----    From: [facility]    Sent: 10/19/2015   9:43 PM      To: Massie MaroonLachina M Hollis, FNP

## 2015-10-27 ENCOUNTER — Other Ambulatory Visit: Payer: Self-pay

## 2015-10-27 DIAGNOSIS — I10 Essential (primary) hypertension: Secondary | ICD-10-CM

## 2015-10-27 MED ORDER — AMLODIPINE BESYLATE 5 MG PO TABS: 5.0000 mg | ORAL_TABLET | Freq: Every day | ORAL | Status: DC

## 2015-11-02 ENCOUNTER — Encounter: Payer: Self-pay | Admitting: Family Medicine

## 2015-11-02 ENCOUNTER — Ambulatory Visit (INDEPENDENT_AMBULATORY_CARE_PROVIDER_SITE_OTHER): Payer: Self-pay | Admitting: Family Medicine

## 2015-11-02 VITALS — BP 130/84 | HR 97 | Temp 98.1°F | Resp 16 | Ht 69.0 in | Wt 203.0 lb

## 2015-11-02 DIAGNOSIS — G629 Polyneuropathy, unspecified: Secondary | ICD-10-CM

## 2015-11-02 DIAGNOSIS — I10 Essential (primary) hypertension: Secondary | ICD-10-CM

## 2015-11-02 DIAGNOSIS — F17200 Nicotine dependence, unspecified, uncomplicated: Secondary | ICD-10-CM

## 2015-11-02 DIAGNOSIS — M792 Neuralgia and neuritis, unspecified: Secondary | ICD-10-CM

## 2015-11-02 MED ORDER — AMITRIPTYLINE HCL 25 MG PO TABS: 25.0000 mg | ORAL_TABLET | Freq: Every day | ORAL | Status: DC

## 2015-11-02 NOTE — Patient Instructions (Signed)
Amitriptyline tablets What is this medicine? AMITRIPTYLINE (a mee TRIP ti leen) is used to treat depression. This medicine may be used for other purposes; ask your health care provider or pharmacist if you have questions. What should I tell my health care provider before I take this medicine? They need to know if you have any of these conditions: -an alcohol problem -asthma, difficulty breathing -bipolar disorder or schizophrenia -difficulty passing urine, prostate trouble -glaucoma -heart disease or previous heart attack -liver disease -over active thyroid -seizures -thoughts or plans of suicide, a previous suicide attempt, or family history of suicide attempt -an unusual or allergic reaction to amitriptyline, other medicines, foods, dyes, or preservatives -pregnant or trying to get pregnant -breast-feeding How should I use this medicine? Take this medicine by mouth with a drink of water. Follow the directions on the prescription label. You can take the tablets with or without food. Take your medicine at regular intervals. Do not take it more often than directed. Do not stop taking this medicine suddenly except upon the advice of your doctor. Stopping this medicine too quickly may cause serious side effects or your condition may worsen. A special MedGuide will be given to you by the pharmacist with each prescription and refill. Be sure to read this information carefully each time. Talk to your pediatrician regarding the use of this medicine in children. Special care may be needed. Overdosage: If you think you have taken too much of this medicine contact a poison control center or emergency room at once. NOTE: This medicine is only for you. Do not share this medicine with others. What if I miss a dose? If you miss a dose, take it as soon as you can. If it is almost time for your next dose, take only that dose. Do not take double or extra doses. What may interact with this medicine? Do not  take this medicine with any of the following medications: -arsenic trioxide -certain medicines used to regulate abnormal heartbeat or to treat other heart conditions -cisapride -droperidol -halofantrine -linezolid -MAOIs like Carbex, Eldepryl, Marplan, Nardil, and Parnate -methylene blue -other medicines for mental depression -phenothiazines like perphenazine, thioridazine and chlorpromazine -pimozide -probucol -procarbazine -sparfloxacin -St. John's Wort -ziprasidone This medicine may also interact with the following medications: -atropine and related drugs like hyoscyamine, scopolamine, tolterodine and others -barbiturate medicines for inducing sleep or treating seizures, like phenobarbital -cimetidine -disulfiram -ethchlorvynol -thyroid hormones such as levothyroxine This list may not describe all possible interactions. Give your health care provider a list of all the medicines, herbs, non-prescription drugs, or dietary supplements you use. Also tell them if you smoke, drink alcohol, or use illegal drugs. Some items may interact with your medicine. What should I watch for while using this medicine? Tell your doctor if your symptoms do not get better or if they get worse. Visit your doctor or health care professional for regular checks on your progress. Because it may take several weeks to see the full effects of this medicine, it is important to continue your treatment as prescribed by your doctor. Patients and their families should watch out for new or worsening thoughts of suicide or depression. Also watch out for sudden changes in feelings such as feeling anxious, agitated, panicky, irritable, hostile, aggressive, impulsive, severely restless, overly excited and hyperactive, or not being able to sleep. If this happens, especially at the beginning of treatment or after a change in dose, call your health care professional. Bonita QuinYou may get drowsy or dizzy. Do not  drive, use machinery, or  do anything that needs mental alertness until you know how this medicine affects you. Do not stand or sit up quickly, especially if you are an older patient. This reduces the risk of dizzy or fainting spells. Alcohol may interfere with the effect of this medicine. Avoid alcoholic drinks. Do not treat yourself for coughs, colds, or allergies without asking your doctor or health care professional for advice. Some ingredients can increase possible side effects. Your mouth may get dry. Chewing sugarless gum or sucking hard candy, and drinking plenty of water will help. Contact your doctor if the problem does not go away or is severe. This medicine may cause dry eyes and blurred vision. If you wear contact lenses you may feel some discomfort. Lubricating drops may help. See your eye doctor if the problem does not go away or is severe. This medicine can cause constipation. Try to have a bowel movement at least every 2 to 3 days. If you do not have a bowel movement for 3 days, call your doctor or health care professional. This medicine can make you more sensitive to the sun. Keep out of the sun. If you cannot avoid being in the sun, wear protective clothing and use sunscreen. Do not use sun lamps or tanning beds/booths. What side effects may I notice from receiving this medicine? Side effects that you should report to your doctor or health care professional as soon as possible: -allergic reactions like skin rash, itching or hives, swelling of the face, lips, or tongue -abnormal production of milk in females -breast enlargement in both males and females -breathing problems -confusion, hallucinations -fast, irregular heartbeat -fever with increased sweating -muscle stiffness, or spasms -pain or difficulty passing urine, loss of bladder control -seizures -suicidal thoughts or other mood changes -swelling of the testicles -tingling, pain, or numbness in the feet or hands -yellowing of the eyes or  skin Side effects that usually do not require medical attention (report to your doctor or health care professional if they continue or are bothersome): -change in sex drive or performance -constipation or diarrhea -nausea, vomiting -weight gain or loss This list may not describe all possible side effects. Call your doctor for medical advice about side effects. You may report side effects to FDA at 1-800-FDA-1088. Where should I keep my medicine? Keep out of the reach of children. Store at room temperature between 20 and 25 degrees C (68 and 77 degrees F). Throw away any unused medicine after the expiration date. NOTE: This sheet is a summary. It may not cover all possible information. If you have questions about this medicine, talk to your doctor, pharmacist, or health care provider.    2016, Elsevier/Gold Standard. (2012-04-28 13:50:32) Neuropathic Pain Neuropathic pain is pain caused by damage to the nerves that are responsible for certain sensations in your body (sensory nerves). The pain can be caused by damage to:   The sensory nerves that send signals to your spinal cord and brain (peripheral nervous system).  The sensory nerves in your brain or spinal cord (central nervous system). Neuropathic pain can make you more sensitive to pain. What would be a minor sensation for most people may feel very painful if you have neuropathic pain. This is usually a long-term condition that can be difficult to treat. The type of pain can differ from person to person. It may start suddenly (acute), or it may develop slowly and last for a long time (chronic). Neuropathic pain may come and go  as damaged nerves heal or may stay at the same level for years. It often causes emotional distress, loss of sleep, and a lower quality of life. CAUSES  The most common cause of damage to a sensory nerve is diabetes. Many other diseases and conditions can also cause neuropathic pain. Causes of neuropathic pain can be  classified as:  Toxic. Many drugs and chemicals can cause toxic damage. The most common cause of toxic neuropathic pain is damage from drug treatment for cancer (chemotherapy).  Metabolic. This type of pain can happen when a disease causes imbalances that damage nerves. Diabetes is the most common of these diseases. Vitamin B deficiency caused by long-term alcohol abuse is another common cause.  Traumatic. Any injury that cuts, crushes, or stretches a nerve can cause damage and pain. A common example is feeling pain after losing an arm or leg (phantom limb pain).  Compression-related. If a sensory nerve gets trapped or compressed for a long period of time, the blood supply to the nerve can be cut off.  Vascular. Many blood vessel diseases can cause neuropathic pain by decreasing blood supply and oxygen to nerves.  Autoimmune. This type of pain results from diseases in which the body's defense system mistakenly attacks sensory nerves. Examples of autoimmune diseases that can cause neuropathic pain include lupus and multiple sclerosis.  Infectious. Many types of viral infections can damage sensory nerves and cause pain. Shingles infection is a common cause of this type of pain.  Inherited. Neuropathic pain can be a symptom of many diseases that are passed down through families (genetic). SIGNS AND SYMPTOMS  The main symptom is pain. Neuropathic pain is often described as:  Burning.  Shock-like.  Stinging.  Hot or cold.  Itching. DIAGNOSIS  No single test can diagnose neuropathic pain. Your health care provider will do a physical exam and ask you about your pain. You may use a pain scale to describe how bad your pain is. You may also have tests to see if you have a high sensitivity to pain and to help find the cause and location of any sensory nerve damage. These tests may include:  Imaging studies, such as:  X-rays.  CT scan.  MRI.  Nerve conduction studies to test how well  nerve signals travel through your sensory nerves (electrodiagnostic testing).  Stimulating your sensory nerves through electrodes on your skin and measuring the response in your spinal cord and brain (somatosensory evoked potentials). TREATMENT  Treatment for neuropathic pain may change over time. You may need to try different treatment options or a combination of treatments. Some options include:  Over-the-counter pain relievers.  Prescription medicines. Some medicines used to treat other conditions may also help neuropathic pain. These include medicines to:  Control seizures (anticonvulsants).  Relieve depression (antidepressants).  Prescription-strength pain relievers (narcotics). These are usually used when other pain relievers do not help.  Transcutaneous nerve stimulation (TENS). This uses electrical currents to block painful nerve signals. The treatment is painless.  Topical and local anesthetics. These are medicines that numb the nerves. They can be injected as a nerve block or applied to the skin.  Alternative treatments, such as:  Acupuncture.  Meditation.  Massage.  Physical therapy.  Pain management programs.  Counseling. HOME CARE INSTRUCTIONS  Learn as much as you can about your condition.  Take medicines only as directed by your health care provider.  Work closely with all your health care providers to find what works best for you.  Have  a good support system at home.  Consider joining a chronic pain support group. SEEK MEDICAL CARE IF:  Your pain treatments are not helping.  You are having side effects from your medicines.  You are struggling with fatigue, mood changes, depression, or anxiety.   This information is not intended to replace advice given to you by your health care provider. Make sure you discuss any questions you have with your health care provider.   Document Released: 09/06/2004 Document Revised: 12/31/2014 Document Reviewed:  05/20/2014 Elsevier Interactive Patient Education [employer]2016 Elsevier Inc.

## 2015-11-02 NOTE — Progress Notes (Signed)
Subjective:    Patient ID: Ricardo Stone, male    DOB: [birth year], 49 y.o.   MRN: [id number]  HPI   Ricardo Stone, a 49 year old male presents for 1 month follow up of neuropathy.He continues to have tingling to right upper and lower extremities. He states that gabapentin is working minimally. He was started on trial of gabapentin 100 mg BID.  He was previously evaluated for stroke due to ongoing numbness, tingling and burning pain to right face, right upper and lower extremities. Patient had a CT to rule out intercranial hemorrhage or stroke, which was unremarkable. Worsened after the increase in gabapentin dosage.  Pain intensity is 7-8 described as burning and shooting.  7-8. He has not taken gabapentin over the past 4 days. He current denies fatigue, gait abnormalities, shortness of breath, dizziness, chest pains, or heart palpitations.       Past Medical History  Diagnosis Date  . Myocardial infarct (HCC) 2008  . Nephrolithiasis   . Hypertension   . Anxiety   . Diverticulitis   . Vertigo   . CAD (coronary artery disease)   . Stroke [facility](HCC)    Social History   Social History  . Marital Status: Divorced    Spouse Name: N/A  . Number of Children: 2  . Years of Education: N/A   Occupational History  . CuratorMECHANIC    Social History Main Topics  . Smoking status: Current Some Day Smoker -- 0.25 packs/day for 20 years    Types: Cigarettes  . Smokeless tobacco: Never Used  . Alcohol Use: 6.0 oz/week    10 Glasses of wine per week     Comment: occasional  . Drug Use: No  . Sexual Activity: Not on file   Other Topics Concern  . Not on file   Social History Narrative   There is no immunization history on file for this patient.  Allergies  Allergen Reactions  . Codeine     Patient gets adggitated   Graphstesi Review of Systems  Constitutional: Negative.  Negative for fever, diaphoresis and fatigue.  HENT: Negative.   Eyes: Negative.   Respiratory: Negative.   Negative for chest tightness and shortness of breath.   Cardiovascular: Negative.  Negative for chest pain, palpitations and leg swelling.  Gastrointestinal: Negative.  Negative for nausea, vomiting and blood in stool.  Endocrine: Negative for cold intolerance, heat intolerance, polydipsia, polyphagia and polyuria.  Genitourinary: Negative.   Musculoskeletal: Positive for myalgias.  Skin: Negative.   Allergic/Immunologic: Negative for immunocompromised state.  Neurological: Positive for numbness (right upper and lower extremities). Negative for dizziness, facial asymmetry and headaches.  Hematological: Negative.   Psychiatric/Behavioral: Negative.  Negative for suicidal ideas and sleep disturbance.       Objective:   Physical Exam  Constitutional: He is oriented to person, place, and time. Vital signs are normal. He appears well-developed and well-nourished.  HENT:  Head: Normocephalic and atraumatic.  Right Ear: Hearing, tympanic membrane, external ear and ear canal normal.  Left Ear: Hearing, tympanic membrane, external ear and ear canal normal.  Nose: Nose normal.  Mouth/Throat: Uvula is midline and oropharynx is clear and moist.  Eyes: Conjunctivae, EOM and lids are normal. Pupils are equal, round, and reactive to light.  Neck: Trachea normal and normal range of motion. Neck supple.  Cardiovascular: Normal rate, regular rhythm, S1 normal, S2 normal, normal heart sounds, intact distal pulses and normal pulses.   Pulmonary/Chest: Effort normal and breath sounds  normal.  Abdominal: Soft. Bowel sounds are normal.  Wears abdominal binder to prevent hernia reoccurrence.   Musculoskeletal: Normal range of motion.  Neurological: He is alert and oriented to person, place, and time. He has normal strength and normal reflexes. He displays no atrophy, no tremor and normal reflexes. No cranial nerve deficit or sensory deficit. He exhibits normal muscle tone. He displays a negative Romberg sign.  Coordination and gait normal.  Reflex Scores:      Tricep reflexes are 2+ on the right side and 2+ on the left side.      Bicep reflexes are 2+ on the right side and 2+ on the left side.      Brachioradialis reflexes are 2+ on the right side and 2+ on the left side.      Patellar reflexes are 2+ on the right side and 2+ on the left side.      Achilles reflexes are 2+ on the right side and 2+ on the left side. Patient able to distinguish between light soft touch and pinprick sensations, has 4/5 motor strength to upper extremities.   Skin: Skin is warm and dry.  Psychiatric: He has a normal mood and affect. His behavior is normal. Judgment and thought content normal.     BP 130/84 mmHg  Pulse 97  Temp(Src) 98.1 F (36.7 C) (Oral)  Resp 16  Ht  (1.753 m)  Wt 203 lb (92.08 kg)  BMI 29.96 kg/m2 Assessment & Plan:  1. Neuropathy (HCC) Patient reports, weakness, numbness, and tingling. No new weaknesses have been identified. Reviewed previous labs, will check HIV status. Patient may warrant a brain MRI and neuropathy referral. Will review case with Dr. Nolene Bernheim.   - HIV antibody (with reflex)  2. Neuropathic pain Started a trial of gabapentin 1 month ago; patient reports that nerve pain has not improved.  - amitriptyline (ELAVIL) 25 MG tablet; Take 1 tablet (25 mg total) by mouth at bedtime.  Dispense: 30 tablet; Refill: 0  3. Essential hypertension Blood pressure is at goal on current medication regimen. Continue medications at current dosage. The patient is asked to make an attempt to improve diet and exercise patterns to aid in medical management of this problem.  4. Tobacco dependency Smoking cessation instruction/counseling given:  counseled patient on the dangers of tobacco use, advised patient to stop smoking, and reviewed strategies to maximize success   RTC: Will follow up by phone in 1 week  The patient was given clear instructions to go to ER or return to medical center  if symptoms do not improve, worsen or new problems develop. The patient verbalized understanding. Will notify patient with laboratory results.   Massie Maroon, FNP

## 2015-11-03 LAB — HIV ANTIBODY (ROUTINE TESTING W REFLEX): HIV 1&2 Ab, 4th Generation: NONREACTIVE

## 2016-01-07 ENCOUNTER — Other Ambulatory Visit: Payer: Self-pay | Admitting: Family Medicine

## 2016-04-02 ENCOUNTER — Other Ambulatory Visit: Payer: Self-pay | Admitting: Family Medicine

## 2016-05-07 ENCOUNTER — Other Ambulatory Visit: Payer: Self-pay

## 2016-05-07 DIAGNOSIS — I10 Essential (primary) hypertension: Secondary | ICD-10-CM

## 2016-05-07 IMAGING — CT CT HEAD W/O CM
2 series · 15 of 30 positions shown, 19 images · non-contrast
Comparison: None.

CLINICAL DATA: Right-sided tingling 1 week improving until 3 days
ago.

EXAM:
CT HEAD WITHOUT CONTRAST
TECHNIQUE: Contiguous axial images were obtained from the base of the skull
through the vertex without intravenous contrast.

[Series 2: head w/o · axial · non-contrast · 0.44mm/px · z∈[+1556,+1691]mm · 13 of 33 slices shown, 17 images]
[im 3/33  brain]
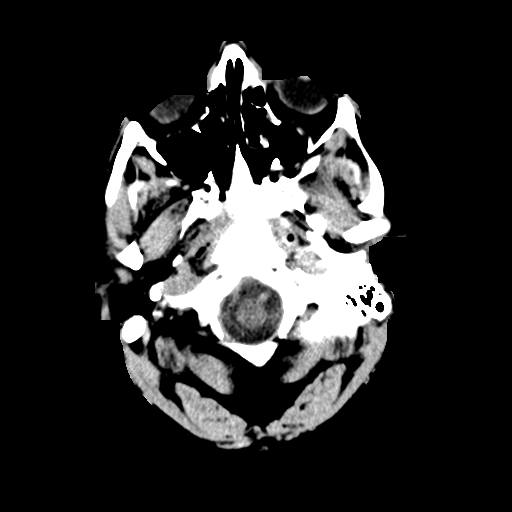
[im 3/33  bone]
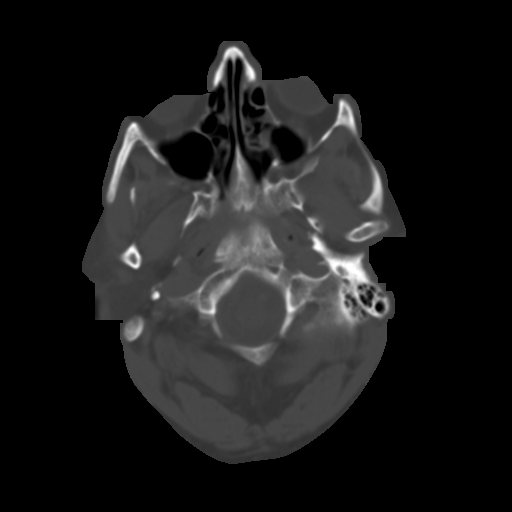
[im 5/33  brain]
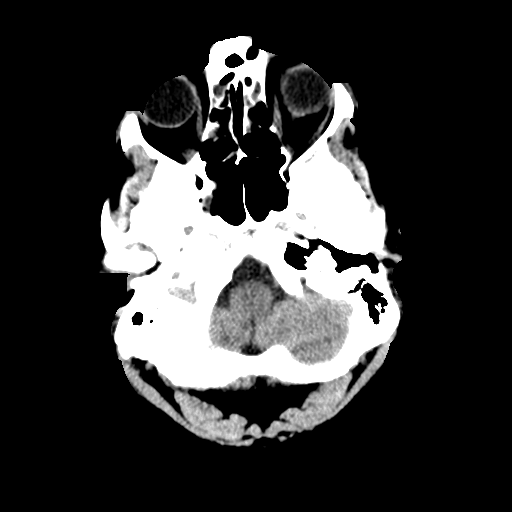
[im 7/33  brain]
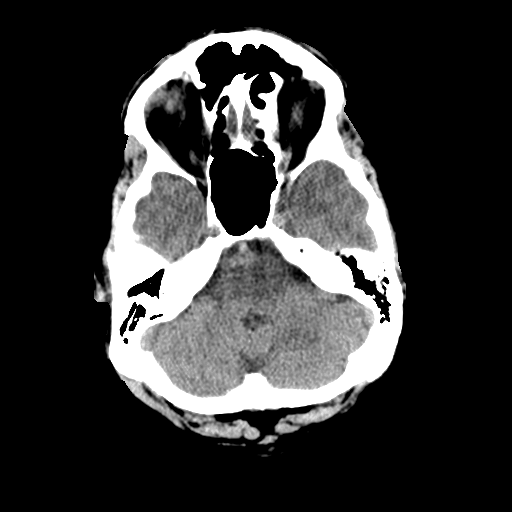
[im 10/33  brain]
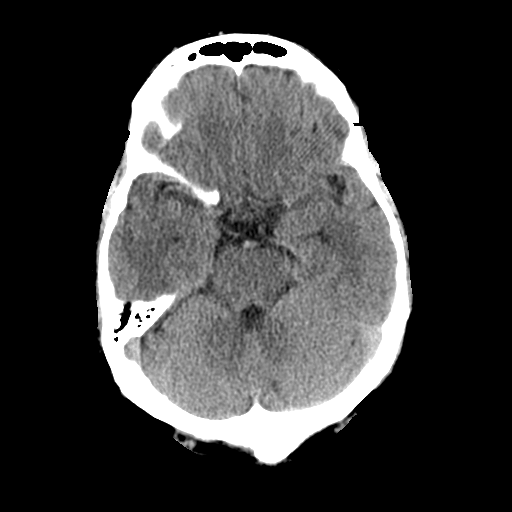
[im 12/33  brain]
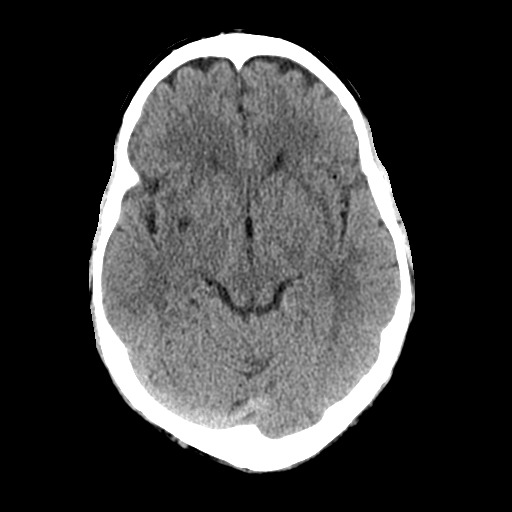
[im 12/33  bone]
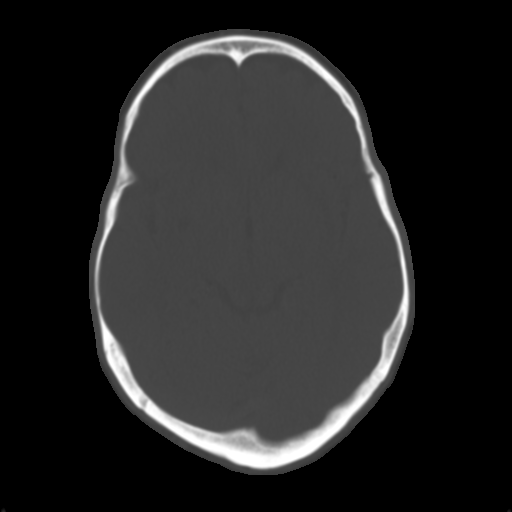
[im 14/33  brain]
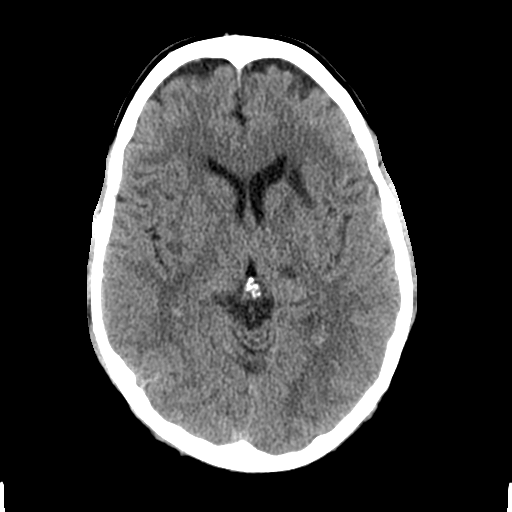
[im 17/33  brain]
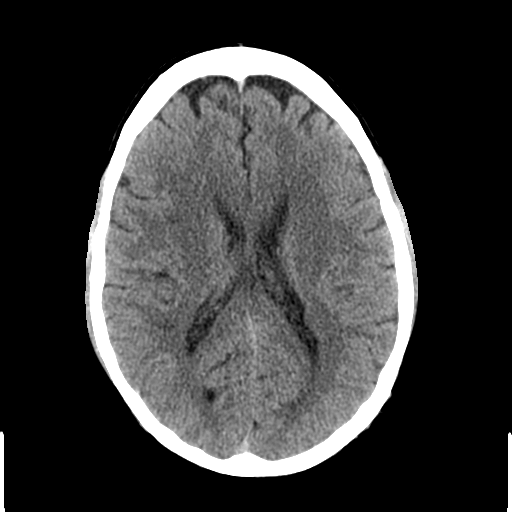
[im 19/33  brain]
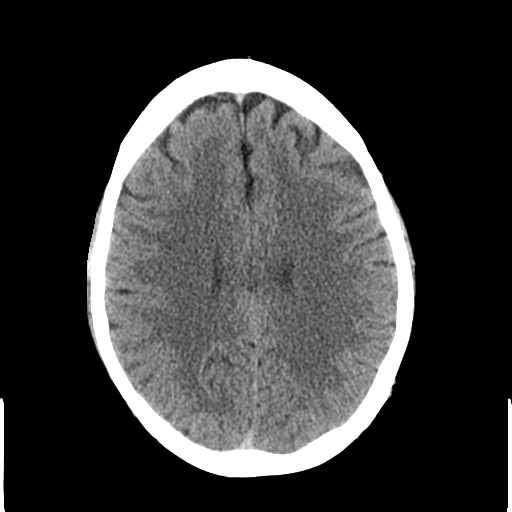
[im 21/33  brain]
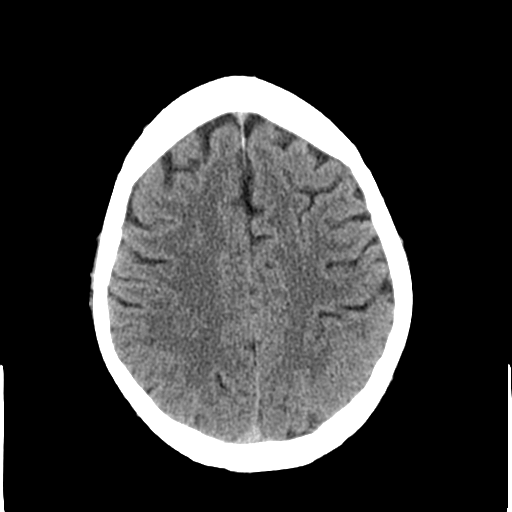
[im 21/33  bone]
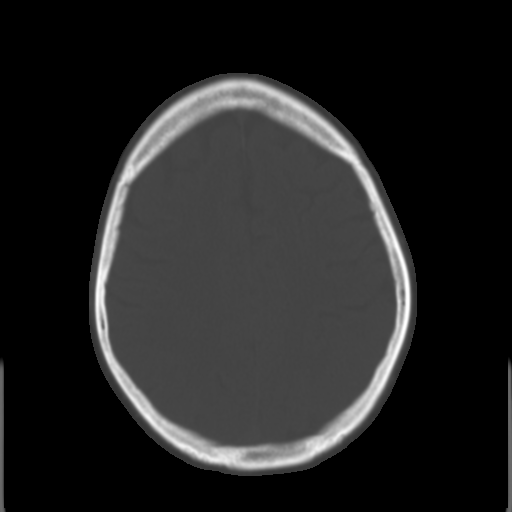
[im 23/33  brain]
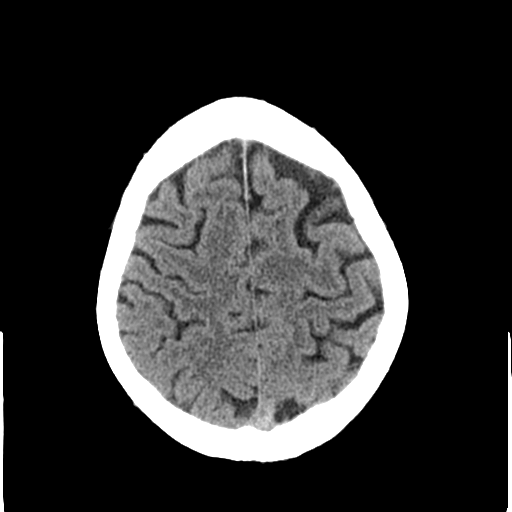
[im 26/33  brain]
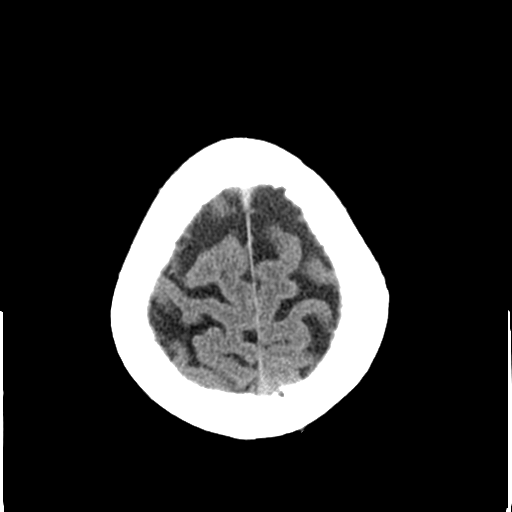
[im 28/33  brain]
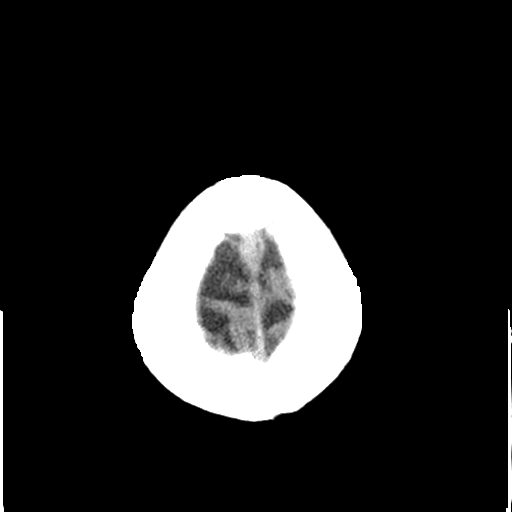
[im 30/33  brain]
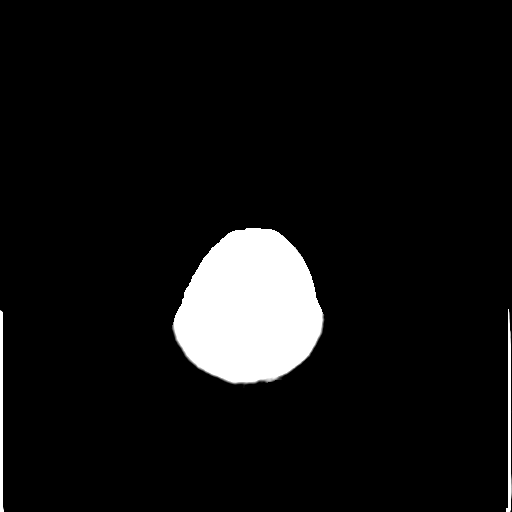
[im 30/33  bone]
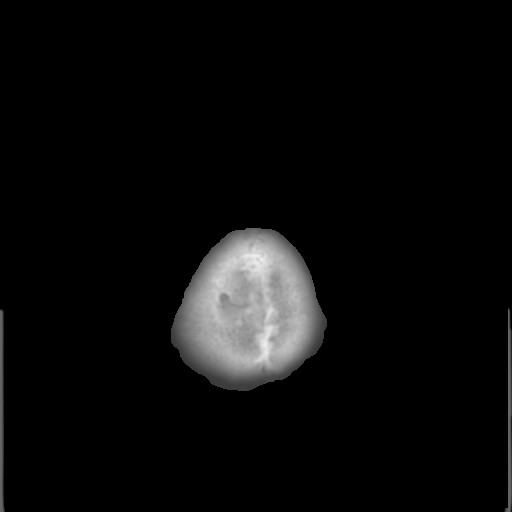

[Series 3: bone windows · axial · 0.44mm/px · z∈[+1556,+1576]mm · 2 of 33 slices shown]
[im 3/33  bone]
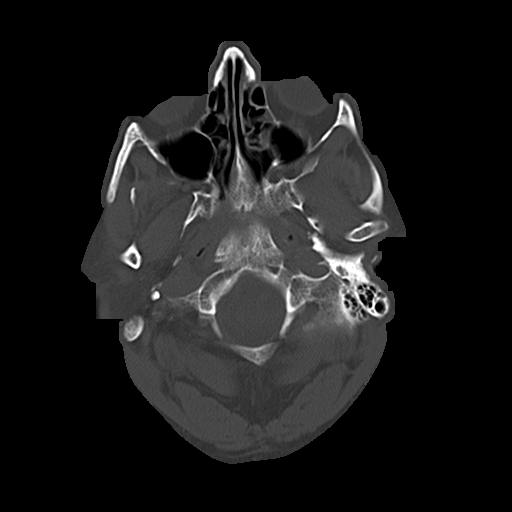
[im 7/33  bone]
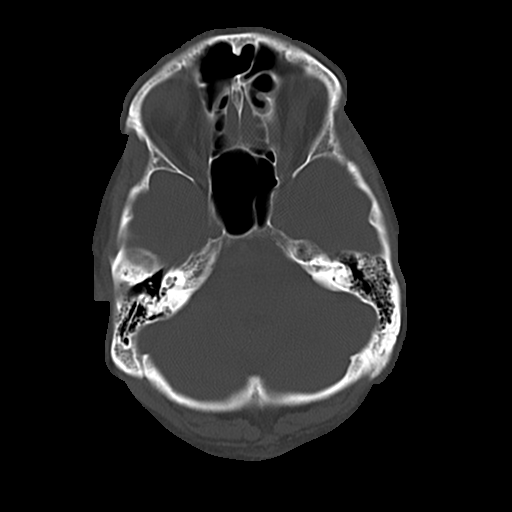

[15 of 30 positions shown; findings below may reference images not displayed]

FINDINGS: Ventricles, cisterns and other CSF spaces are within normal. There
is a 2 cm ovoid hypodensity over the left frontal region between the
left frontal horn and insular cortex likely an old focal infarct.
There is focal 8 mm hypodensity over the left thalamus as well as 7
mm hypodensity over the right external capsule likely old lacunar
infarcts. There is no focal mass, mass effect, shift of midline
structures or acute hemorrhage. No evidence of acute infarction.
There is mild opacification over the ethmoid and right sphenoid
sinus.
IMPRESSION: No acute intracranial findings.

Focal hypodensities over the left frontal white matter, left
thalamus and right external capsule likely old focal/ lacunar
infarcts.

## 2016-05-07 MED ORDER — LISINOPRIL 20 MG PO TABS: 20.0000 mg | ORAL_TABLET | Freq: Every day | ORAL | Status: AC

## 2016-05-07 MED ORDER — AMLODIPINE BESYLATE 5 MG PO TABS: 5.0000 mg | ORAL_TABLET | Freq: Every day | ORAL | Status: AC

## 2016-05-07 NOTE — Telephone Encounter (Signed)
Refilled 1 month rx for amlodipine and lisinopril, advised that patient must make appointment for more refills. Thanks!

## 2020-11-23 DEATH — deceased
